# Patient Record
Sex: Female | Born: 1935 | Smoking: Never smoker
Health system: Southern US, Community
[De-identification: ages and names within clinical notes are randomized; demographics above are authoritative.]

## PROBLEM LIST (undated history)

## (undated) DIAGNOSIS — I639 Cerebral infarction, unspecified: Secondary | ICD-10-CM

## (undated) DIAGNOSIS — E119 Type 2 diabetes mellitus without complications: Secondary | ICD-10-CM

## (undated) DIAGNOSIS — E785 Hyperlipidemia, unspecified: Secondary | ICD-10-CM

## (undated) DIAGNOSIS — I10 Essential (primary) hypertension: Secondary | ICD-10-CM

---

## 2017-11-12 ENCOUNTER — Encounter: Payer: Self-pay | Admitting: Emergency Medicine

## 2017-11-12 ENCOUNTER — Emergency Department
Admission: EM | Admit: 2017-11-12 | Discharge: 2017-11-12 | Disposition: A | Discharge status: Home / Self Care | Payer: Medicare Other | Attending: Emergency Medicine | Admitting: Emergency Medicine

## 2017-11-12 DIAGNOSIS — E119 Type 2 diabetes mellitus without complications: Secondary | ICD-10-CM | POA: Diagnosis not present

## 2017-11-12 DIAGNOSIS — I10 Essential (primary) hypertension: Secondary | ICD-10-CM | POA: Diagnosis not present

## 2017-11-12 DIAGNOSIS — Z79899 Other long term (current) drug therapy: Secondary | ICD-10-CM | POA: Insufficient documentation

## 2017-11-12 DIAGNOSIS — E785 Hyperlipidemia, unspecified: Secondary | ICD-10-CM | POA: Diagnosis not present

## 2017-11-12 DIAGNOSIS — Z760 Encounter for issue of repeat prescription: Secondary | ICD-10-CM | POA: Diagnosis present

## 2017-11-12 HISTORY — DX: Hyperlipidemia, unspecified: E78.5

## 2017-11-12 HISTORY — DX: Cerebral infarction, unspecified: I63.9

## 2017-11-12 HISTORY — DX: Essential (primary) hypertension: I10

## 2017-11-12 HISTORY — DX: Type 2 diabetes mellitus without complications: E11.9

## 2017-11-12 MED ORDER — LOSARTAN POTASSIUM 50 MG PO TABS: 50.0000 mg | ORAL_TABLET | Freq: Every day | ORAL | 0 refills | Status: DC

## 2017-11-12 MED ORDER — GABAPENTIN 300 MG PO CAPS: 300.0000 mg | ORAL_CAPSULE | Freq: Every day | ORAL | 0 refills | Status: DC

## 2017-11-12 MED ORDER — AMLODIPINE BESYLATE 5 MG PO TABS: 5.0000 mg | ORAL_TABLET | Freq: Every day | ORAL | 0 refills | Status: DC

## 2017-11-12 MED ORDER — ATORVASTATIN CALCIUM 40 MG PO TABS: 40.0000 mg | ORAL_TABLET | Freq: Every day | ORAL | 0 refills | Status: AC

## 2017-11-12 NOTE — ED Triage Notes (Signed)
PT comes into the ED via POV c/o a need for medication refill.  Patient returned home from [place] yesterday and [facility] cannot get her in until Friday.  Patient is completely out of all her medications for gabapentin, atorvastatin, amlodipine, and losartan.  Patient had a recent stroke while in [place] visiting her family, so her children are concerned about her being out of her blood pressure and cholesterol medication.  Patient denies any problems or pain at this time and in NAD with even and unlabored respirations.

## 2017-11-12 NOTE — ED Provider Notes (Signed)
[facility] Emergency Department Provider Note   ____________________________________________   First MD Initiated Contact with Patient 11/12/17 1501     (approximate)  I have reviewed the triage vital signs and the nursing notes.   HISTORY  Chief Complaint Medication Refill    HPI Madison Barnett is a 84 y.o. female patient today for refill of medication.  Patient is return home from [place] yesterday and was told she could not get appointment for refills at another clinic until November 17, 2017.  Patient is completely out of Neurontin, atorvastatin, Norvasc, and Losaratan.  Patient had a recent CVA while visiting her family in [place] so her children here concerned for being out of medication for 1 week.  Patient denies any distress at this time.  Past Medical History:  Diagnosis Date  . Diabetes mellitus without complication (HCC)   . Hyperlipidemia   . Hypertension   . Stroke [facility](HCC)     There are no active problems to display for this patient.   History reviewed. No pertinent surgical history.  Prior to Admission medications   Medication Sig Start Date End Date Taking? Authorizing Provider  amLODipine (NORVASC) 5 MG tablet Take 5 mg by mouth daily.   Yes [provider]  atorvastatin (LIPITOR) 40 MG tablet Take 40 mg by mouth daily.   Yes [provider]  gabapentin (NEURONTIN) 300 MG capsule Take 300 mg by mouth at bedtime.   Yes [provider]  losartan (COZAAR) 50 MG tablet Take 50 mg by mouth daily.   Yes [provider]  amLODipine (NORVASC) 5 MG tablet Take 1 tablet (5 mg total) by mouth daily. 11/12/17 11/12/18  Joni ReiningSmith, Ronald K, PA-C  atorvastatin (LIPITOR) 40 MG tablet Take 1 tablet (40 mg total) by mouth at bedtime. 11/12/17   Joni ReiningSmith, Ronald K, PA-C  gabapentin (NEURONTIN) 300 MG capsule Take 1 capsule (300 mg total) by mouth at bedtime. 11/12/17 11/12/18  Joni ReiningSmith, Ronald K, PA-C  losartan (COZAAR) 50 MG tablet  Take 1 tablet (50 mg total) by mouth daily. 11/12/17 11/12/18  Joni ReiningSmith, Ronald K, PA-C    Allergies Patient has no known allergies.  No family history on file.  Social History Social History   Tobacco Use  . Smoking status: Never Smoker  . Smokeless tobacco: Never Used  Substance Use Topics  . Alcohol use: No    Frequency: Never  . Drug use: No    Review of Systems Constitutional: No fever/chills Eyes: No visual changes. ENT: No sore throat. Cardiovascular: Denies chest pain. Respiratory: Denies shortness of breath. Gastrointestinal: No abdominal pain.  No nausea, no vomiting.  No diarrhea.  No constipation. Genitourinary: Negative for dysuria. Musculoskeletal: Negative for back pain. Skin: Negative for rash. Neurological: Negative for headaches, focal weakness or numbness. Endocrine:Diabetes, hyperlipidemia, hypertension.  ____________________________________________   PHYSICAL EXAM:  VITAL SIGNS: ED Triage Vitals [11/12/17 1431]  Enc Vitals Group     BP (!) 167/63     Pulse Rate 80     Resp 16     Temp 98.4 F (36.9 C)     Temp Source Oral     SpO2 95 %     Weight 125 lb (56.7 kg)     Height 5\' 1"  (1.549 m)     Head Circumference      Peak Flow      Pain Score      Pain Loc      Pain Edu?  Excl. in GC?     Constitutional: Alert and oriented. Well appearing and in no acute distress. Eyes: Conjunctivae are normal. PERRL. EOMI. Neck: No stridor.  Hematological/Lymphatic/Immunilogical: No cervical lymphadenopathy. Cardiovascular: Normal rate, regular rhythm. Grossly normal heart sounds.  Good peripheral circulation. Respiratory: Normal respiratory effort.  No retractions. Lungs CTAB. Musculoskeletal: No lower extremity tenderness nor edema.  No joint effusions. Neurologic:  Normal speech and language. No gross focal neurologic deficits are appreciated. No gait instability. Skin:  Skin is warm, dry and intact. No rash noted. Psychiatric: Mood and affect  are normal. Speech and behavior are normal.  ____________________________________________   LABS (all labs ordered are listed, but only abnormal results are displayed)  Labs Reviewed - No data to display ____________________________________________  EKG   ____________________________________________  RADIOLOGY  No results found.  ____________________________________________   PROCEDURES  Procedure(s) performed: None  Procedures  Critical Care performed: No  ____________________________________________   INITIAL IMPRESSION / ASSESSMENT AND PLAN / ED COURSE  As part of my medical decision making, I reviewed the following data within the electronic MEDICAL RECORD NUMBER    Patient presents for medication refill.  Patient documentation reveals medication need for hypertension and hyperlipidemia.  Patient advised to follow-up with scheduled appointment on November 17, 2017.      ____________________________________________   FINAL CLINICAL IMPRESSION(S) / ED DIAGNOSES  Final diagnoses:  Encounter for medication refill     ED Discharge Orders        Ordered    losartan (COZAAR) 50 MG tablet  Daily     11/12/17 1512    gabapentin (NEURONTIN) 300 MG capsule  Daily at bedtime     11/12/17 1512    atorvastatin (LIPITOR) 40 MG tablet  Daily at bedtime     11/12/17 1512    amLODipine (NORVASC) 5 MG tablet  Daily     11/12/17 1512       Note:  This document was prepared using Dragon voice recognition software and may include unintentional dictation errors.    Joni Reining, PA-C 11/12/17 1520    Phineas Semen, MD 11/12/17 [phone]

## 2017-11-12 NOTE — ED Notes (Signed)
See triage note  Per family she recently moved back here with family from [place]  She has [facility]

## 2017-12-29 ENCOUNTER — Encounter: Payer: Self-pay | Admitting: Emergency Medicine

## 2017-12-29 ENCOUNTER — Other Ambulatory Visit: Payer: Self-pay

## 2017-12-29 ENCOUNTER — Emergency Department
Admission: EM | Admit: 2017-12-29 | Discharge: 2017-12-29 | Disposition: A | Discharge status: Home / Self Care | Payer: Medicare Other | Attending: Emergency Medicine | Admitting: Emergency Medicine

## 2017-12-29 DIAGNOSIS — E1342 Other specified diabetes mellitus with diabetic polyneuropathy: Secondary | ICD-10-CM | POA: Diagnosis not present

## 2017-12-29 DIAGNOSIS — Z79899 Other long term (current) drug therapy: Secondary | ICD-10-CM | POA: Diagnosis not present

## 2017-12-29 DIAGNOSIS — Z7982 Long term (current) use of aspirin: Secondary | ICD-10-CM | POA: Diagnosis not present

## 2017-12-29 DIAGNOSIS — Z794 Long term (current) use of insulin: Secondary | ICD-10-CM | POA: Insufficient documentation

## 2017-12-29 DIAGNOSIS — I10 Essential (primary) hypertension: Secondary | ICD-10-CM | POA: Insufficient documentation

## 2017-12-29 DIAGNOSIS — M79672 Pain in left foot: Secondary | ICD-10-CM | POA: Diagnosis present

## 2017-12-29 DIAGNOSIS — Z8673 Personal history of transient ischemic attack (TIA), and cerebral infarction without residual deficits: Secondary | ICD-10-CM | POA: Diagnosis not present

## 2017-12-29 LAB — GLUCOSE, CAPILLARY: Glucose-Capillary: 186 mg/dL — ABNORMAL HIGH (ref 65–99)

## 2017-12-29 MED ORDER — GABAPENTIN 600 MG PO TABS
300.0000 mg | ORAL_TABLET | Freq: Once | ORAL | Status: AC
  Administered 2017-12-29: 300 mg via ORAL
  Filled 2017-12-29: qty 1

## 2017-12-29 MED ORDER — GABAPENTIN 300 MG PO CAPS: 300.0000 mg | ORAL_CAPSULE | Freq: Every day | ORAL | 0 refills | Status: AC

## 2017-12-29 NOTE — ED Notes (Signed)
Patient to room 9, Emma RN aware of placement in room.

## 2017-12-29 NOTE — ED Provider Notes (Signed)
[facility] Emergency Department Provider Note  ____________________________________________   I have reviewed the triage vital signs and the nursing notes. Where available I have reviewed prior notes and, if possible and indicated, outside hospital notes.    HISTORY  Chief Complaint Foot Pain    HPI Madison Barnett is a 84 y.o. female with a history of diabetes, a long history of diabetic neuropathy with burning to her feet for several years.  She was on gabapentin for a long time and that The symptoms under control however about a month ago she was taken off the gabapentin and now she has had gradually recurring burning sensation in both feet.  Patient is not clear why she had gabapentin taken off and I do not see any clear indication in the records as to why that happened.  She states that it is mostly at night she has a burning feeling in both feet.  She denies any trauma numbness or weakness, she denies any fever or chills, is worse when she lies down and is trying to go to bed.  She does not have to significant symptoms at this time.  Family would also like to be referred to an endocrinologist as an outpatient.   *   Past Medical History:  Diagnosis Date  . Diabetes mellitus without complication (HCC)   . Hyperlipidemia   . Hypertension   . Stroke [facility](HCC)     There are no active problems to display for this patient.   History reviewed. No pertinent surgical history.  Prior to Admission medications   Medication Sig Start Date End Date Taking? Authorizing Provider  amLODipine (NORVASC) 5 MG tablet Take 1 tablet (5 mg total) by mouth daily. 11/12/17 11/12/18 Yes Joni ReiningSmith, Ronald K, PA-C  aspirin EC 81 MG tablet Take 81 mg by mouth daily.   Yes [provider]  atorvastatin (LIPITOR) 40 MG tablet Take 1 tablet (40 mg total) by mouth at bedtime. 11/12/17  Yes Joni ReiningSmith, Ronald K, PA-C  gabapentin (NEURONTIN) 300 MG capsule Take 1 capsule (300 mg total) by  mouth at bedtime. 11/12/17 11/12/18 Yes Joni ReiningSmith, Ronald K, PA-C  insulin glargine (LANTUS) 100 UNIT/ML injection Inject 25 Units into the skin at bedtime.   Yes [provider]  insulin lispro (HUMALOG) 100 UNIT/ML injection Inject 0-4 Units into the skin. Per sliding scale   Yes [provider]  levothyroxine (SYNTHROID, LEVOTHROID) 300 MCG tablet Take 300 mcg by mouth daily before breakfast.   Yes [provider]  losartan (COZAAR) 50 MG tablet Take 1 tablet (50 mg total) by mouth daily. 11/12/17 11/12/18 Yes Joni ReiningSmith, Ronald K, PA-C  metFORMIN (GLUCOPHAGE) 500 MG tablet Take 500 mg by mouth 2 (two) times daily.   Yes [provider]  multivitamin-lutein (OCUVITE-LUTEIN) CAPS capsule Take 1 capsule by mouth daily.   Yes [provider]    Allergies Patient has no known allergies.  No family history on file.  Social History Social History   Tobacco Use  . Smoking status: Never Smoker  . Smokeless tobacco: Never Used  Substance Use Topics  . Alcohol use: No    Frequency: Never  . Drug use: No    Review of Systems Constitutional: No fever/chills Eyes: No visual changes. ENT: No sore throat. No stiff neck no neck pain Cardiovascular: Denies chest pain. Respiratory: Denies shortness of breath. Gastrointestinal:   no vomiting.  No diarrhea.  No constipation. Genitourinary: Negative for dysuria. Musculoskeletal: Negative lower extremity swelling Skin:  Negative for rash. Neurological: Negative for severe headaches, focal weakness or numbness.   ____________________________________________   PHYSICAL EXAM:  VITAL SIGNS: ED Triage Vitals  Enc Vitals Group     BP 12/29/17 0925 (!) 156/52     Pulse Rate 12/29/17 0925 84     Resp 12/29/17 0925 18     Temp 12/29/17 0925 98.5 F (36.9 C)     Temp Source 12/29/17 0925 Oral     SpO2 12/29/17 0925 97 %     Weight 12/29/17 0926 124 lb (56.2 kg)     Height 12/29/17 0926 5\' 1"  (1.549 m)      Head Circumference --      Peak Flow --      Pain Score 12/29/17 0925 3     Pain Loc --      Pain Edu? --      Excl. in GC? --     Constitutional: Alert and oriented. Well appearing and in no acute distress. Eyes: Conjunctivae are normal Head: Atraumatic HEENT: No congestion/rhinnorhea. Mucous membranes are moist.  Oropharynx non-erythematous Neck:   Nontender with no meningismus, no masses, no stridor Cardiovascular: Normal rate, regular rhythm. Grossly normal heart sounds.  Good peripheral circulation. Respiratory: Normal respiratory effort.  No retractions. Lungs CTAB. Abdominal: Soft and nontender. No distention. No guarding no rebound Back:  There is no focal tenderness or step off.  there is no midline tenderness there are no lesions noted. there is no CVA tenderness Musculoskeletal: No lower extremity tenderness, no upper extremity tenderness. No joint effusions, no DVT signs strong distal pulses no edema, both feet are warm well perfused with good pulses there is intact sensation and there is no evidence of infection or trauma they are nontender, compartments are soft, there is no redness or streaking or abscess etc. Neurologic:  Normal speech and language. No gross focal neurologic deficits are appreciated.  Skin:  Skin is warm, dry and intact. No rash noted. Psychiatric: Mood and affect are normal. Speech and behavior are normal.  ____________________________________________   LABS (all labs ordered are listed, but only abnormal results are displayed)  Labs Reviewed  GLUCOSE, CAPILLARY - Abnormal; Notable for the following components:      Result Value   Glucose-Capillary 186 (*)    All other components within normal limits  CBG MONITORING, ED    Pertinent labs  results that were available during my care of the patient were reviewed by me and considered in my medical decision making (see chart for details). ____________________________________________  EKG  I  personally interpreted any EKGs ordered by me or triage  ____________________________________________  RADIOLOGY  Pertinent labs & imaging results that were available during my care of the patient were reviewed by me and considered in my medical decision making (see chart for details). If possible, patient and/or family made aware of any abnormal findings.  No results found. ____________________________________________    PROCEDURES  Procedure(s) performed: None  Procedures  Critical Care performed: None  ____________________________________________   INITIAL IMPRESSION / ASSESSMENT AND PLAN / ED COURSE  Pertinent labs & imaging results that were available during my care of the patient were reviewed by me and considered in my medical decision making (see chart for details).  Patient here with burning sensation in both feet for years worse after going off her Neurontin, we will restart her Neurontin and she will follow closely with her primary care doctor.  We will also refer her as an outpatient to endocrinology  at her request.  Nothing to suggest trauma, fracture, infection, vascular insufficiency at this time.    ____________________________________________   FINAL CLINICAL IMPRESSION(S) / ED DIAGNOSES  Final diagnoses:  None      This chart was dictated using voice recognition software.  Despite best efforts to proofread,  errors can occur which can change meaning.      Jeanmarie Plant, MD 12/29/17 [phone]

## 2017-12-29 NOTE — ED Triage Notes (Signed)
Son states pt has been c/o bilateral foot pain, mostly at night, concerned this is r/t her diabetes, does not hurt with walking, has not taken ibuprofen or tylenol for pain.

## 2017-12-29 NOTE — ED Notes (Signed)
Patient presents with bilateral foot pain. Patient describes the pain as a "burning". Patient reports it started last night. No skin issues to feet noted. Pulses and sensation presents bilaterally. Brisk cap refill noted. Hx of diabetes.

## 2017-12-29 NOTE — ED Notes (Signed)
First Nurse Note:  [facility] complaining of foot pain bilaterally.  States she wants to see an endocrinologist because she is diabetic.

## 2018-09-23 ENCOUNTER — Emergency Department: Payer: Medicare (Managed Care)

## 2018-09-23 ENCOUNTER — Observation Stay: Payer: Medicare (Managed Care)

## 2018-09-23 ENCOUNTER — Observation Stay
Admission: EM | Admit: 2018-09-23 | Discharge: 2018-09-25 | Disposition: A | Discharge status: Home / Self Care | Payer: Medicare (Managed Care) | Attending: Internal Medicine | Admitting: Internal Medicine

## 2018-09-23 ENCOUNTER — Other Ambulatory Visit: Payer: Self-pay

## 2018-09-23 DIAGNOSIS — I10 Essential (primary) hypertension: Secondary | ICD-10-CM | POA: Insufficient documentation

## 2018-09-23 DIAGNOSIS — E039 Hypothyroidism, unspecified: Secondary | ICD-10-CM | POA: Diagnosis not present

## 2018-09-23 DIAGNOSIS — W06XXXA Fall from bed, initial encounter: Secondary | ICD-10-CM | POA: Insufficient documentation

## 2018-09-23 DIAGNOSIS — Z8673 Personal history of transient ischemic attack (TIA), and cerebral infarction without residual deficits: Secondary | ICD-10-CM | POA: Diagnosis not present

## 2018-09-23 DIAGNOSIS — W19XXXA Unspecified fall, initial encounter: Secondary | ICD-10-CM

## 2018-09-23 DIAGNOSIS — B9620 Unspecified Escherichia coli [E. coli] as the cause of diseases classified elsewhere: Secondary | ICD-10-CM | POA: Diagnosis not present

## 2018-09-23 DIAGNOSIS — E119 Type 2 diabetes mellitus without complications: Secondary | ICD-10-CM | POA: Diagnosis not present

## 2018-09-23 DIAGNOSIS — Z7982 Long term (current) use of aspirin: Secondary | ICD-10-CM | POA: Insufficient documentation

## 2018-09-23 DIAGNOSIS — Z79899 Other long term (current) drug therapy: Secondary | ICD-10-CM | POA: Diagnosis not present

## 2018-09-23 DIAGNOSIS — M170 Bilateral primary osteoarthritis of knee: Secondary | ICD-10-CM | POA: Insufficient documentation

## 2018-09-23 DIAGNOSIS — E785 Hyperlipidemia, unspecified: Secondary | ICD-10-CM | POA: Diagnosis not present

## 2018-09-23 DIAGNOSIS — R531 Weakness: Secondary | ICD-10-CM

## 2018-09-23 DIAGNOSIS — N390 Urinary tract infection, site not specified: Secondary | ICD-10-CM | POA: Diagnosis present

## 2018-09-23 DIAGNOSIS — S32592A Other specified fracture of left pubis, initial encounter for closed fracture: Secondary | ICD-10-CM | POA: Diagnosis not present

## 2018-09-23 DIAGNOSIS — Z794 Long term (current) use of insulin: Secondary | ICD-10-CM | POA: Diagnosis not present

## 2018-09-23 DIAGNOSIS — M25552 Pain in left hip: Secondary | ICD-10-CM

## 2018-09-23 LAB — CBC
HCT: 40.3 % (ref 36.0–46.0)
Hemoglobin: 13.6 g/dL (ref 12.0–15.0)
MCH: 31.8 pg (ref 26.0–34.0)
MCHC: 33.7 g/dL (ref 30.0–36.0)
MCV: 94.2 fL (ref 80.0–100.0)
NRBC: 0 % (ref 0.0–0.2)
PLATELETS: 252 10*3/uL (ref 150–400)
RBC: 4.28 MIL/uL (ref 3.87–5.11)
RDW: 11.6 % (ref 11.5–15.5)
WBC: 11.2 10*3/uL — AB (ref 4.0–10.5)

## 2018-09-23 LAB — URINALYSIS, ROUTINE W REFLEX MICROSCOPIC
Bilirubin Urine: NEGATIVE
GLUCOSE, UA: NEGATIVE mg/dL
HGB URINE DIPSTICK: NEGATIVE
Ketones, ur: NEGATIVE mg/dL
Nitrite: NEGATIVE
Protein, ur: 30 mg/dL — AB
SPECIFIC GRAVITY, URINE: 1.006 (ref 1.005–1.030)
SQUAMOUS EPITHELIAL / LPF: NONE SEEN (ref 0–5)
WBC, UA: >50 WBC/hpf — ABNORMAL HIGH (ref 0–5)
pH: 7 (ref 5.0–8.0)

## 2018-09-23 LAB — COMPREHENSIVE METABOLIC PANEL
ALBUMIN: 4 g/dL (ref 3.5–5.0)
ALT: 18 U/L (ref 0–44)
AST: 26 U/L (ref 15–41)
Alkaline Phosphatase: 93 U/L (ref 38–126)
Anion gap: 10 (ref 5–15)
BUN: 19 mg/dL (ref 8–23)
CHLORIDE: 101 mmol/L (ref 98–111)
CO2: 27 mmol/L (ref 22–32)
Calcium: 9 mg/dL (ref 8.9–10.3)
Creatinine, Ser: 0.82 mg/dL (ref 0.44–1.00)
GFR calc Af Amer: >60 mL/min (ref >60)
GLUCOSE: 171 mg/dL — AB (ref 70–99)
POTASSIUM: 3.9 mmol/L (ref 3.5–5.1)
Sodium: 138 mmol/L (ref 135–145)
Total Bilirubin: 1.4 mg/dL — ABNORMAL HIGH (ref 0.3–1.2)
Total Protein: 8.1 g/dL (ref 6.5–8.1)

## 2018-09-23 LAB — GLUCOSE, CAPILLARY
GLUCOSE-CAPILLARY: 178 mg/dL — AB (ref 70–99)
Glucose-Capillary: 189 mg/dL — ABNORMAL HIGH (ref 70–99)

## 2018-09-23 LAB — TROPONIN I

## 2018-09-23 MED ORDER — ASPIRIN EC 81 MG PO TBEC
81.0000 mg | DELAYED_RELEASE_TABLET | Freq: Every day | ORAL | Status: DC
  Administered 2018-09-24 – 2018-09-25 (×2): 81 mg via ORAL
  Filled 2018-09-23 (×2): qty 1

## 2018-09-23 MED ORDER — INSULIN GLARGINE 100 UNIT/ML ~~LOC~~ SOLN
18.0000 [IU] | Freq: Every day | SUBCUTANEOUS | Status: DC
  Administered 2018-09-23 – 2018-09-24 (×2): 18 [IU] via SUBCUTANEOUS
  Filled 2018-09-23 (×3): qty 0.18

## 2018-09-23 MED ORDER — ONDANSETRON HCL 4 MG PO TABS: 4.0000 mg | ORAL_TABLET | Freq: Four times a day (QID) | ORAL | Status: DC | PRN

## 2018-09-23 MED ORDER — POLYETHYLENE GLYCOL 3350 17 G PO PACK: 17.0000 g | PACK | Freq: Every day | ORAL | Status: DC | PRN

## 2018-09-23 MED ORDER — ACETAMINOPHEN 650 MG RE SUPP: 650.0000 mg | Freq: Four times a day (QID) | RECTAL | Status: DC | PRN

## 2018-09-23 MED ORDER — INSULIN ASPART 100 UNIT/ML ~~LOC~~ SOLN: 0.0000 [IU] | Freq: Every day | SUBCUTANEOUS | Status: DC

## 2018-09-23 MED ORDER — LOSARTAN POTASSIUM 50 MG PO TABS
50.0000 mg | ORAL_TABLET | Freq: Every day | ORAL | Status: DC
  Administered 2018-09-24 – 2018-09-25 (×2): 50 mg via ORAL
  Filled 2018-09-23 (×2): qty 1

## 2018-09-23 MED ORDER — ONDANSETRON HCL 4 MG/2ML IJ SOLN: 4.0000 mg | Freq: Four times a day (QID) | INTRAMUSCULAR | Status: DC | PRN

## 2018-09-23 MED ORDER — ENOXAPARIN SODIUM 40 MG/0.4ML ~~LOC~~ SOLN
40.0000 mg | SUBCUTANEOUS | Status: DC
  Administered 2018-09-23 – 2018-09-24 (×2): 40 mg via SUBCUTANEOUS
  Filled 2018-09-23 (×2): qty 0.4

## 2018-09-23 MED ORDER — SODIUM CHLORIDE 0.9 % IV SOLN
1.0000 g | Freq: Once | INTRAVENOUS | Status: AC
  Administered 2018-09-23: 1 g via INTRAVENOUS
  Filled 2018-09-23: qty 10

## 2018-09-23 MED ORDER — INSULIN ASPART 100 UNIT/ML ~~LOC~~ SOLN
0.0000 [IU] | Freq: Three times a day (TID) | SUBCUTANEOUS | Status: DC
  Administered 2018-09-23 – 2018-09-25 (×5): 3 [IU] via SUBCUTANEOUS
  Administered 2018-09-25: 11 [IU] via SUBCUTANEOUS
  Filled 2018-09-23 (×6): qty 1

## 2018-09-23 MED ORDER — CEPHALEXIN 500 MG PO CAPS: 500.0000 mg | ORAL_CAPSULE | Freq: Two times a day (BID) | ORAL | 0 refills | Status: DC

## 2018-09-23 MED ORDER — ATORVASTATIN CALCIUM 20 MG PO TABS
40.0000 mg | ORAL_TABLET | Freq: Every day | ORAL | Status: DC
  Administered 2018-09-23 – 2018-09-24 (×2): 40 mg via ORAL
  Filled 2018-09-23 (×2): qty 2

## 2018-09-23 MED ORDER — LEVOTHYROXINE SODIUM 100 MCG PO TABS
300.0000 ug | ORAL_TABLET | Freq: Every day | ORAL | Status: DC
  Filled 2018-09-23: qty 3

## 2018-09-23 MED ORDER — AMLODIPINE BESYLATE 5 MG PO TABS
5.0000 mg | ORAL_TABLET | Freq: Every day | ORAL | Status: DC
  Administered 2018-09-24 – 2018-09-25 (×2): 5 mg via ORAL
  Filled 2018-09-23 (×2): qty 1

## 2018-09-23 MED ORDER — ACETAMINOPHEN 325 MG PO TABS
650.0000 mg | ORAL_TABLET | Freq: Four times a day (QID) | ORAL | Status: DC | PRN
  Administered 2018-09-23: 650 mg via ORAL
  Filled 2018-09-23: qty 2

## 2018-09-23 MED ORDER — GABAPENTIN 300 MG PO CAPS
300.0000 mg | ORAL_CAPSULE | Freq: Every day | ORAL | Status: DC
  Administered 2018-09-23 – 2018-09-24 (×2): 300 mg via ORAL
  Filled 2018-09-23 (×2): qty 1

## 2018-09-23 NOTE — ED Notes (Signed)
Dr Lenard LancePaduchowski talked to pt and per their discussion pt will be admitted for the night

## 2018-09-23 NOTE — ED Provider Notes (Addendum)
[facility] Emergency Department Provider Note  Time seen: 7:28 AM  I have reviewed the triage vital signs and the nursing notes.   HISTORY  Chief Complaint Fall    HPI Madison Barnett is a 84 y.o. female with a past medical history of diabetes, hypertension, CVA, presents to the emergency department after a fall with generalized weakness.  According to the patient and her son over the past 1 week she has had somewhat progressive generalized weakness.  States she had a fall yesterday and then again this morning due to the weakness.  Patient is complaining of some mild pain in her right hip and pelvis.  Denies hitting her head.  Denies LOC.  Son states yesterday and today the patient has required assistance with ambulating and normally she is able to ambulate on her own.  Patient denies any fever, cough or congestion denies any vomiting or diarrhea.   Past Medical History:  Diagnosis Date  . Diabetes mellitus without complication (HCC)   . Hyperlipidemia   . Hypertension   . Stroke [facility](HCC)     There are no active problems to display for this patient.   History reviewed. No pertinent surgical history.  Prior to Admission medications   Medication Sig Start Date End Date Taking? Authorizing Provider  amLODipine (NORVASC) 5 MG tablet Take 1 tablet (5 mg total) by mouth daily. 11/12/17 11/12/18  Joni ReiningSmith, Ronald K, PA-C  aspirin EC 81 MG tablet Take 81 mg by mouth daily.    [provider]  atorvastatin (LIPITOR) 40 MG tablet Take 1 tablet (40 mg total) by mouth at bedtime. 11/12/17   Joni ReiningSmith, Ronald K, PA-C  gabapentin (NEURONTIN) 300 MG capsule Take 1 capsule (300 mg total) by mouth at bedtime. 12/29/17 12/29/18  Jeanmarie PlantMcShane, James A, MD  insulin glargine (LANTUS) 100 UNIT/ML injection Inject 25 Units into the skin at bedtime.    [provider]  insulin lispro (HUMALOG) 100 UNIT/ML injection Inject 0-4 Units into the skin. Per sliding scale    [provider]  levothyroxine (SYNTHROID, LEVOTHROID) 300 MCG tablet Take 300 mcg by mouth daily before breakfast.    [provider]  losartan (COZAAR) 50 MG tablet Take 1 tablet (50 mg total) by mouth daily. 11/12/17 11/12/18  Joni ReiningSmith, Ronald K, PA-C  metFORMIN (GLUCOPHAGE) 500 MG tablet Take 500 mg by mouth 2 (two) times daily.    [provider]  multivitamin-lutein (OCUVITE-LUTEIN) CAPS capsule Take 1 capsule by mouth daily.    [provider]    No Known Allergies  No family history on file.  Social History Social History   Tobacco Use  . Smoking status: Never Smoker  . Smokeless tobacco: Never Used  Substance Use Topics  . Alcohol use: No    Frequency: Never  . Drug use: No    Review of Systems Constitutional: Negative for fever.  Positive for generalized weakness. Cardiovascular: Negative for chest pain. Respiratory: Negative for shortness of breath. Gastrointestinal: Negative for abdominal pain, vomiting  Genitourinary: Negative for urinary compaints Musculoskeletal: Right hip/pelvis pain Skin: Negative for skin complaints  Neurological: Negative for headache All other ROS negative  ____________________________________________   PHYSICAL EXAM:  VITAL SIGNS: ED Triage Vitals  Enc Vitals Group     BP 09/23/18 0640 (!) 156/68     Pulse Rate 09/23/18 0640 88     Resp 09/23/18 0640 17     Temp 09/23/18 0640 98 F (36.7 C)  Temp src --      SpO2 09/23/18 0640 97 %     Weight 09/23/18 0641 131 lb (59.4 kg)     Height 09/23/18 0641 5' 2.5" (1.588 m)     Head Circumference --      Peak Flow --      Pain Score 09/23/18 0641 9     Pain Loc --      Pain Edu? --      Excl. in GC? --    Constitutional: Alert and oriented. Well appearing and in no distress. Eyes: Normal exam ENT   Head: Normocephalic and atraumatic.   Mouth/Throat: Mucous membranes are moist. Cardiovascular: Normal rate, regular rhythm.  Respiratory: Normal  respiratory effort without tachypnea nor retractions. Breath sounds are clear Gastrointestinal: Soft and nontender. No distention.   Musculoskeletal: Good range of motion in bilateral upper extremities without pain or tenderness, patient does have mild pain with range of motion of right lower extremity in the right hip/pelvis.  Great range of motion left lower extremity.  Hips are nontender to palpation bilaterally. Neurologic:  Normal speech and language. No gross focal neurologic deficits are appreciated. Skin:  Skin is warm, dry and intact.  Psychiatric: Mood and affect are normal. Speech and behavior are normal.   ____________________________________________    EKG  EKG viewed and interpreted by myself shows normal sinus rhythm 85 bpm with a narrow QRS, normal axis, normal intervals, no concerning ST changes.  ____________________________________________    RADIOLOGY  X-rays are negative.  ____________________________________________   INITIAL IMPRESSION / ASSESSMENT AND PLAN / ED COURSE  Pertinent labs & imaging results that were available during my care of the patient were reviewed by me and considered in my medical decision making (see chart for details).  Patient presents to the emergency department for generalized fatigue weakness and a fall this morning complaining of pain in her right hip/pelvis.  We will check x-rays given the fall.  We will check labs given the complaint of generalized weakness over the past 1 week.  Currently the patient appears well, no distress.  X-rays are negative.  Labs are largely within normal limits besides her urinalysis which shows a significant urinary tract infection.  We will dose IV antibiotics.  Otherwise the patient appears well.  Discussed options with the patient, we will discharge home with oral antibiotics.    Patient wants to go home however she is spoken to her son who believes the patient is too weak as he states he has had to  carry her yesterday and this morning due to generalized weakness and states normally she ambulates without issue.  Given her significant weakness with 2 falls in the past 24 hours and a significant urinary tract infection we will admit to the hospital service for continued treatment.  ____________________________________________   FINAL CLINICAL IMPRESSION(S) / ED DIAGNOSES  Pelvic pain Fall Weakness Urinary tract infection   Minna Antis, MD 09/23/18 1007    Minna Antis, MD 09/23/18 1042

## 2018-09-23 NOTE — ED Triage Notes (Signed)
Patient coming [facility] from home for fall from bed; approx 3 foot fall. Patient c/o right hip, leg, groin, and knee pain post fall.   EMS vitals: 160/90, 98% RA, 97 bpm, 178 CBG

## 2018-09-23 NOTE — ED Notes (Signed)
ED TO INPATIENT HANDOFF REPORT  Name/Age/Gender Madison Barnett Madison Barnett 84 y.o. female  Code Status   Home/SNF/Other Home  Chief Complaint Fall  Level of Care/Admitting Diagnosis ED Disposition    ED Disposition Condition Comment   Admit  Hospital Area: [facility] [[id number]]  Level of Care: Med-Surg [16]  Diagnosis: UTI (urinary tract infection) [914782]  Admitting Physician: Willadean Carol DODD [[id number]]  Attending Physician: Willadean Carol DODD [[id number]]  PT Class (Do Not Modify): Observation [104]  PT Acc Code (Do Not Modify): Observation [10022]       Medical History Past Medical History:  Diagnosis Date  . Diabetes mellitus without complication (HCC)   . Hyperlipidemia   . Hypertension   . Stroke [facility])     Allergies No Known Allergies  IV Location/Drains/Wounds Patient Lines/Drains/Airways Status   Active Line/Drains/Airways    Name:   Placement date:   Placement time:   Site:   Days:   Peripheral IV 09/23/18 Left Antecubital   09/23/18    0758    Antecubital   less than 1          Labs/Imaging Results for orders placed or performed during the hospital encounter of 09/23/18 (from the past 48 hour(s))  CBC     Status: Abnormal   Collection Time: 09/23/18  7:59 AM  Result Value Ref Range   WBC 11.2 (H) 4.0 - 10.5 K/uL   RBC 4.28 3.87 - 5.11 MIL/uL   Hemoglobin 13.6 12.0 - 15.0 g/dL   HCT 96.2 95.2 - 84.1 %   MCV 94.2 80.0 - 100.0 fL   MCH 31.8 26.0 - 34.0 pg   MCHC 33.7 30.0 - 36.0 g/dL   RDW 32.4 40.1 - 02.7 %   Platelets 252 150 - 400 K/uL   nRBC 0.0 0.0 - 0.2 %    Comment: Performed at [facility], [address] [place], [place] [address]  Comprehensive metabolic panel     Status: Abnormal   Collection Time: 09/23/18  7:59 AM  Result Value Ref Range   Sodium 138 135 - 145 mmol/L   Potassium 3.9 3.5 - 5.1 mmol/L   Chloride 101 98 - 111 mmol/L   CO2 27 22 - 32 mmol/L   Glucose, Bld 171 (H) 70 - 99 mg/dL   BUN 19 8 - 23  mg/dL   Creatinine, Ser 4.40 0.44 - 1.00 mg/dL   Calcium 9.0 8.9 - 34.7 mg/dL   Total Protein 8.1 6.5 - 8.1 g/dL   Albumin 4.0 3.5 - 5.0 g/dL   AST 26 15 - 41 U/L   ALT 18 0 - 44 U/L   Alkaline Phosphatase 93 38 - 126 U/L   Total Bilirubin 1.4 (H) 0.3 - 1.2 mg/dL   GFR calc non Af Amer >60 >60 mL/min   GFR calc Af Amer >60 >60 mL/min   Anion gap 10 5 - 15    Comment: Performed at [facility], [address] [place], [place] [address]  Troponin I - ONCE - STAT     Status: None   Collection Time: 09/23/18  7:59 AM  Result Value Ref Range   Troponin I <0.03 <0.03 ng/mL    Comment: Performed at [facility], [address] [place], [place] [address]  Urinalysis, Routine w reflex microscopic     Status: Abnormal   Collection Time: 09/23/18  7:59 AM  Result Value Ref Range   Color, Urine YELLOW (A) YELLOW  APPearance TURBID (A) CLEAR   Specific Gravity, Urine 1.006 1.005 - 1.030   pH 7.0 5.0 - 8.0   Glucose, UA NEGATIVE NEGATIVE mg/dL   Hgb urine dipstick NEGATIVE NEGATIVE   Bilirubin Urine NEGATIVE NEGATIVE   Ketones, ur NEGATIVE NEGATIVE mg/dL   Protein, ur 30 (A) NEGATIVE mg/dL   Nitrite NEGATIVE NEGATIVE   Leukocytes, UA LARGE (A) NEGATIVE   RBC / HPF >50 (H) 0 - 5 RBC/hpf   WBC, UA >50 (H) 0 - 5 WBC/hpf   Bacteria, UA MANY (A) NONE SEEN   Squamous Epithelial / LPF NONE SEEN 0 - 5   WBC Clumps PRESENT    Mucus PRESENT     Comment: Performed at [facility], [address] [place], [place] [address]   Dg Knee Complete 4 Views Right  Result Date: 09/23/2018 CLINICAL DATA:  Fall.  Right knee pain.  Initial encounter. EXAM: RIGHT KNEE - COMPLETE 4+ VIEW COMPARISON:  None. FINDINGS: No evidence of fracture, dislocation, or joint effusion. No evidence of arthropathy or other focal bone abnormality. Generalized osteopenia and peripheral vascular calcification noted. Soft tissues are unremarkable. IMPRESSION: No acute findings. Osteopenia and  peripheral vascular calcification. Electronically Signed   By: Myles RosenthalJohn  Stahl M.D.   On: 09/23/2018 08:02   Dg Hip Unilat  With Pelvis 2-3 Views Right  Result Date: 09/23/2018 CLINICAL DATA:  Initial encounter for Fall from bed this morning as well as yesterday morning. Approx 3 foot fall. Patient states right hip, leg, groin, and knee pain following fall. Hx of stroke. EXAM: DG HIP (WITH OR WITHOUT PELVIS) 2-3V RIGHT COMPARISON:  None. FINDINGS: Femoral heads are located. Vascular calcifications. Osteopenia. No acute fracture. IMPRESSION: No acute osseous abnormality. Electronically Signed   By: Jeronimo GreavesKyle  Talbot M.D.   On: 09/23/2018 08:02    Pending Labs Unresulted Labs (From admission, onward)    Start     Ordered   09/23/18 1009  Urine Culture  Add-on,   AD     09/23/18 1009   Signed and Held  CBC  (enoxaparin (LOVENOX)    CrCl >/= 30 ml/min)  Once,   R    Comments:  Baseline for enoxaparin therapy IF NOT ALREADY DRAWN.  Notify MD if PLT < 100 K.    Signed and Held   Signed and Held  Creatinine, serum  (enoxaparin (LOVENOX)    CrCl >/= 30 ml/min)  Once,   R    Comments:  Baseline for enoxaparin therapy IF NOT ALREADY DRAWN.    Signed and Held   Signed and Held  Creatinine, serum  (enoxaparin (LOVENOX)    CrCl >/= 30 ml/min)  Weekly,   R    Comments:  while on enoxaparin therapy    Signed and Held   Signed and Held  Basic metabolic panel  Tomorrow morning,   R     Signed and Held   Signed and Held  CBC  Tomorrow morning,   R     Signed and Held          Vitals/Pain Today's Vitals   09/23/18 1000 09/23/18 1030 09/23/18 1100 09/23/18 1130  BP: (!) 149/58 (!) 144/56 128/75 (!) 143/58  Pulse: 87 83    Resp: (!) 24 17 18  (!) 26  Temp:      SpO2: 97% 97%    Weight:      Height:      PainSc:        Isolation Precautions No  active isolations  Medications Medications  cefTRIAXone (ROCEPHIN) 1 g in sodium chloride 0.9 % 100 mL IVPB (0 g Intravenous Stopped 09/23/18 1039)     Mobility Walks at home

## 2018-09-23 NOTE — Progress Notes (Signed)
Family Meeting Note  Advance Directive:yes  Today a meeting took place with the Patient and daughter and granddaughter.  Patient is able to participate.  The following clinical team members were present during this meeting:MD  The following were discussed:Patient's diagnosis: , Patient's progosis: Unable to determine and Goals for treatment: DNR   Patient states that she has thought a lot about resuscitation, especially after she had to make some difficult decisions when her husband became very ill and passed away.  She states that she is overall pretty healthy, although she does have some chronic medical conditions including hypertension, type 2 diabetes, hyperlipidemia, and hypothyroidism.  She states that she would not want to be resuscitated due to her age.  Her daughter and granddaughter support her wishes.  Will make patient DNR.  Additional follow-up to be provided: prn  Time spent during discussion:20 minutes  Madison SinclairKaty D Mayo, MD

## 2018-09-23 NOTE — H&P (Signed)
[facility] [place] [facility]   PATIENT NAME: Madison Barnett    MR#:  [id number]  DATE OF BIRTH:  21-Dec-1932  DATE OF ADMISSION:  09/23/2018  PRIMARY CARE PHYSICIAN: Barbette ReichmannHande, Vishwanath, MD   REQUESTING/REFERRING PHYSICIAN: Minna AntisKevin Paduchowski, MD  CHIEF COMPLAINT:   Chief Complaint  Patient presents with  . Fall    HISTORY OF PRESENT ILLNESS:  Madison Barnett  is a 84 y.o. female with a known history of hypertension, type 2 diabetes, hyperlipidemia, hypothyroidism, history of stroke who presents to the ED with bilateral leg pain and mechanical fall x2.  She fell yesterday and again this morning.  She states this was due to generalized weakness and chronic bilateral hip and knee pain.  She did not hit her head.  She did not lose consciousness.  She denies any fevers, chills, or feeling bad.  She denies any dysuria, frequency, urgency, or suprapubic abdominal pain.  In the ED, but also are unremarkable.  Labs are significant for WBC 11.2.  UA with large leukocytes, many bacteria, >50 RBCs.  She was given a dose of ceftriaxone.  Hospitalists were called for admission.  PAST MEDICAL HISTORY:   Past Medical History:  Diagnosis Date  . Diabetes mellitus without complication (HCC)   . Hyperlipidemia   . Hypertension   . Stroke [facility](HCC)     PAST SURGICAL HISTORY:  History reviewed. No pertinent surgical history.  SOCIAL HISTORY:   Social History   Tobacco Use  . Smoking status: Never Smoker  . Smokeless tobacco: Never Used  Substance Use Topics  . Alcohol use: No    Frequency: Never    FAMILY HISTORY:  No family history on file.  DRUG ALLERGIES:  No Known Allergies  REVIEW OF SYSTEMS:   Review of Systems  Constitutional: Negative for chills and fever.  HENT: Negative for congestion and sore throat.   Eyes: Negative for blurred vision and double vision.  Respiratory: Negative for cough and shortness of breath.   Cardiovascular: Negative for chest  pain, palpitations and leg swelling.  Gastrointestinal: Negative for abdominal pain, diarrhea, nausea and vomiting.  Genitourinary: Negative for dysuria, flank pain, frequency, hematuria and urgency.  Musculoskeletal: Positive for falls and joint pain. Negative for back pain.  Neurological: Negative for dizziness and headaches.  Psychiatric/Behavioral: Negative for depression. The patient is not nervous/anxious.     MEDICATIONS AT HOME:   Prior to Admission medications   Medication Sig Start Date End Date Taking? Authorizing Provider  amLODipine (NORVASC) 5 MG tablet Take 1 tablet (5 mg total) by mouth daily. 11/12/17 11/12/18 Yes Joni ReiningSmith, Ronald K, PA-C  aspirin EC 81 MG tablet Take 81 mg by mouth daily.   Yes [provider]  atorvastatin (LIPITOR) 40 MG tablet Take 1 tablet (40 mg total) by mouth at bedtime. 11/12/17  Yes Joni ReiningSmith, Ronald K, PA-C  gabapentin (NEURONTIN) 300 MG capsule Take 1 capsule (300 mg total) by mouth at bedtime. 12/29/17 12/29/18 Yes McShane, Rudy JewJames A, MD  insulin glargine (LANTUS) 100 UNIT/ML injection Inject 25 Units into the skin at bedtime.   Yes [provider]  insulin lispro (HUMALOG) 100 UNIT/ML injection Inject 0-4 Units into the skin. Per sliding scale   Yes [provider]  losartan (COZAAR) 50 MG tablet Take 1 tablet (50 mg total) by mouth daily. 11/12/17 11/12/18 Yes Joni ReiningSmith, Ronald K, PA-C  metFORMIN (GLUCOPHAGE) 500 MG tablet Take 500 mg by mouth 2 (two) times daily.   Yes  [provider]  multivitamin-lutein (OCUVITE-LUTEIN) CAPS capsule Take 1 capsule by mouth daily.   Yes [provider]  cephALEXin (KEFLEX) 500 MG capsule Take 1 capsule (500 mg total) by mouth 2 (two) times daily for 7 days. 09/23/18 09/30/18  Minna Antis, MD  levothyroxine (SYNTHROID, LEVOTHROID) 300 MCG tablet Take 300 mcg by mouth daily before breakfast.    [provider]      VITAL SIGNS:  Blood pressure (!) 143/58, pulse 83,  temperature 98 F (36.7 C), resp. rate (!) 26, height 5' 2.5" (1.588 m), weight 59.4 kg, SpO2 97 %.  PHYSICAL EXAMINATION:  Physical Exam  GENERAL:  84 y.o.-year-old patient lying in the bed with no acute distress.  EYES: Pupils equal, round, reactive to light and accommodation. No scleral icterus. Extraocular muscles intact.  HEENT: Head atraumatic, normocephalic. Oropharynx and nasopharynx clear.  Moist mucous membranes. NECK:  Supple, no jugular venous distention. No thyroid enlargement, no tenderness.  LUNGS: Normal breath sounds bilaterally, no wheezing, rales,rhonchi or crepitation. No use of accessory muscles of respiration.  CARDIOVASCULAR: RRR, S1, S2 normal. No murmurs, rubs, or gallops.  ABDOMEN: Soft, nontender, nondistended. Bowel sounds present. No organomegaly or mass.  EXTREMITIES: No pedal edema, cyanosis, or clubbing. + Medial and lateral joint line tenderness in both knees bilaterally.  No erythema or edema. + Tenderness with range of motion of both hips. NEUROLOGIC: Cranial nerves II through XII are intact. Muscle strength 5/5 in all extremities. Sensation intact. Gait not checked.  PSYCHIATRIC: The patient is alert and oriented x 3.  SKIN: No obvious rash, lesion, or ulcer.   LABORATORY PANEL:   CBC Recent Labs  Lab 09/23/18 0759  WBC 11.2*  HGB 13.6  HCT 40.3  PLT 252   ------------------------------------------------------------------------------------------------------------------  Chemistries  Recent Labs  Lab 09/23/18 0759  NA 138  K 3.9  CL 101  CO2 27  GLUCOSE 171*  BUN 19  CREATININE 0.82  CALCIUM 9.0  AST 26  ALT 18  ALKPHOS 93  BILITOT 1.4*   ------------------------------------------------------------------------------------------------------------------  Cardiac Enzymes Recent Labs  Lab 09/23/18 0759  TROPONINI <0.03    ------------------------------------------------------------------------------------------------------------------  RADIOLOGY:  Dg Knee Complete 4 Views Right  Result Date: 09/23/2018 CLINICAL DATA:  Fall.  Right knee pain.  Initial encounter. EXAM: RIGHT KNEE - COMPLETE 4+ VIEW COMPARISON:  None. FINDINGS: No evidence of fracture, dislocation, or joint effusion. No evidence of arthropathy or other focal bone abnormality. Generalized osteopenia and peripheral vascular calcification noted. Soft tissues are unremarkable. IMPRESSION: No acute findings. Osteopenia and peripheral vascular calcification. Electronically Signed   By: Myles Rosenthal M.D.   On: 09/23/2018 08:02   Dg Hip Unilat  With Pelvis 2-3 Views Right  Result Date: 09/23/2018 CLINICAL DATA:  Initial encounter for Fall from bed this morning as well as yesterday morning. Approx 3 foot fall. Patient states right hip, leg, groin, and knee pain following fall. Hx of stroke. EXAM: DG HIP (WITH OR WITHOUT PELVIS) 2-3V RIGHT COMPARISON:  None. FINDINGS: Femoral heads are located. Vascular calcifications. Osteopenia. No acute fracture. IMPRESSION: No acute osseous abnormality. Electronically Signed   By: Jeronimo Greaves M.D.   On: 09/23/2018 08:02      IMPRESSION AND PLAN:   UTI- UA consistent with infection, although patient denies any urinary symptoms. WBC only mildly elevated to 11.2 with no other signs of infection or sepsis, so may just be asymptomatic bacteriuria. -Will continue ceftriaxone for now -Follow-up urine culture -Encourage po intake  Mechanical fall- due  to weakness and chronic bilateral hip and knee pain (likely osteoarthritis). Chronic leg pain may also be due to PVD due to multiple risk factors and peripheral vascular calcifications seen on right knee x-ray -Will obtain bilateral hip and knee x-rays -PT/OT consult -Consider ABIs and vascular surgery referral as an outpatient  Hypertension- BPs mildly elevated in the  ED -Continue home norvasc and losartan  Type 2 diabetes- hyperglycemic in the ED, recent A1c 7.9 -Lantus 18 units qhs -Moderate SSI  Hypothyroidism- stable, recent TSH normal -Continue home synthroid  Hyperlipidemia- stable -Continue home lipitor  History of stroke- no residual deficits -Continue aspirin and lipitor  All the records are reviewed and case discussed with ED provider. Management plans discussed with the patient, family and they are in agreement.  CODE STATUS: DNR  TOTAL TIME TAKING CARE OF THIS PATIENT: 45 minutes.    Jinny Blossom  M.D on 09/23/2018 at 11:38 AM  Between 7am to 6pm - Pager - [phone]  After 6pm go to [email or web address] - Social research officer, government  [facility]  Office  [phone]  CC: Primary care physician; Barbette Reichmann, MD   Note: This dictation was prepared with Dragon dictation along with smaller phrase technology. Any transcriptional errors that result from this process are unintentional.

## 2018-09-23 NOTE — ED Notes (Addendum)
Awaiting atb to finish prior to discharge

## 2018-09-23 NOTE — Care Management Obs Status (Signed)
MEDICARE OBSERVATION STATUS NOTIFICATION   Patient Details  Name: Madison Barnett MRN: [id number] Date of Birth: 1933/03/15   Medicare Observation Status Notification Given:  Yes    Kendel Bessey A Zea Kostka, RN 09/23/2018, 2:01 PM

## 2018-09-23 NOTE — Plan of Care (Signed)
The patient is very weak from the lower extremities. No falls during this shift. Bed alarm on. Son want to be present tomorrow when PT/OT see the patient in order to talk to them.  No skin breakdown shown. Urinary incontinent.  Problem: Education: Goal: Knowledge of General Education information will improve Description Including pain rating scale, medication(s)/side effects and non-pharmacologic comfort measures Outcome: Progressing   Problem: Health Behavior/Discharge Planning: Goal: Ability to manage health-related needs will improve Outcome: Progressing   Problem: Clinical Measurements: Goal: Ability to maintain clinical measurements within normal limits will improve Outcome: Progressing Goal: Will remain free from infection Outcome: Progressing Goal: Diagnostic test results will improve Outcome: Progressing Goal: Respiratory complications will improve Outcome: Progressing Goal: Cardiovascular complication will be avoided Outcome: Progressing   Problem: Activity: Goal: Risk for activity intolerance will decrease Outcome: Progressing   Problem: Nutrition: Goal: Adequate nutrition will be maintained Outcome: Progressing   Problem: Coping: Goal: Level of anxiety will decrease Outcome: Progressing   Problem: Elimination: Goal: Will not experience complications related to bowel motility Outcome: Progressing Goal: Will not experience complications related to urinary retention Outcome: Progressing   Problem: Pain Managment: Goal: General experience of comfort will improve Outcome: Progressing   Problem: Safety: Goal: Ability to remain free from injury will improve Outcome: Progressing   Problem: Skin Integrity: Goal: Risk for impaired skin integrity will decrease Outcome: Progressing

## 2018-09-24 LAB — BASIC METABOLIC PANEL
Anion gap: 8 (ref 5–15)
BUN: 22 mg/dL (ref 8–23)
CO2: 25 mmol/L (ref 22–32)
Calcium: 8.6 mg/dL — ABNORMAL LOW (ref 8.9–10.3)
Chloride: 103 mmol/L (ref 98–111)
Creatinine, Ser: 0.96 mg/dL (ref 0.44–1.00)
GFR calc Af Amer: >60 mL/min (ref >60)
GFR, EST NON AFRICAN AMERICAN: 55 mL/min — AB (ref >60)
Glucose, Bld: 169 mg/dL — ABNORMAL HIGH (ref 70–99)
POTASSIUM: 3.6 mmol/L (ref 3.5–5.1)
Sodium: 136 mmol/L (ref 135–145)

## 2018-09-24 LAB — CBC
HCT: 41.6 % (ref 36.0–46.0)
HEMOGLOBIN: 13.4 g/dL (ref 12.0–15.0)
MCH: 31.2 pg (ref 26.0–34.0)
MCHC: 32.2 g/dL (ref 30.0–36.0)
MCV: 97 fL (ref 80.0–100.0)
Platelets: 270 10*3/uL (ref 150–400)
RBC: 4.29 MIL/uL (ref 3.87–5.11)
RDW: 11.7 % (ref 11.5–15.5)
WBC: 8.8 10*3/uL (ref 4.0–10.5)
nRBC: 0 % (ref 0.0–0.2)

## 2018-09-24 LAB — GLUCOSE, CAPILLARY
GLUCOSE-CAPILLARY: 152 mg/dL — AB (ref 70–99)
GLUCOSE-CAPILLARY: 169 mg/dL — AB (ref 70–99)
Glucose-Capillary: 168 mg/dL — ABNORMAL HIGH (ref 70–99)
Glucose-Capillary: 185 mg/dL — ABNORMAL HIGH (ref 70–99)

## 2018-09-24 LAB — TSH: TSH: 6.225 u[IU]/mL — ABNORMAL HIGH (ref 0.350–4.500)

## 2018-09-24 MED ORDER — SODIUM CHLORIDE 0.9 % IV SOLN
1.0000 g | INTRAVENOUS | Status: DC
  Administered 2018-09-24: 1 g via INTRAVENOUS
  Filled 2018-09-24: qty 1
  Filled 2018-09-24 (×2): qty 10

## 2018-09-24 NOTE — [place] FL2 (Signed)
[place] MEDICAID FL2 LEVEL OF CARE SCREENING TOOL     IDENTIFICATION  Patient Name: Madison Barnett Birthdate: 07-Apr-1933 Sex: female Admission Date (Current Location): 09/23/2018  [place]   Facility and Address:  [facility], [address], [place], [place] [address]      Provider Number: [id number]  Attending Physician Name and Address:  Enedina FinnerPatel, Sona, MD  Relative Name and Phone Number:       Current Level of Care: Hospital Recommended Level of Care: Skilled Nursing Facility Prior Approval Number:    Date Approved/Denied:   PASRR Number:    Discharge Plan: SNF    Current Diagnoses: Patient Active Problem List   Diagnosis Date Noted  . UTI (urinary tract infection) 09/23/2018    Orientation RESPIRATION BLADDER Height & Weight     Self, Time, Situation, Place  Normal Incontinent Weight: 144 lb 10 oz (65.6 kg) Height:  5\' 2"  (157.5 cm)  BEHAVIORAL SYMPTOMS/MOOD NEUROLOGICAL BOWEL NUTRITION STATUS  (none) (none) Continent Diet(heart healthy/carb modified)  AMBULATORY STATUS COMMUNICATION OF NEEDS Skin   Extensive Assist Verbally Normal                       Personal Care Assistance Level of Assistance  Bathing, Dressing Bathing Assistance: Limited assistance   Dressing Assistance: Limited assistance     Functional Limitations Info  (none)          SPECIAL CARE FACTORS FREQUENCY  PT (By licensed PT)                    Contractures Contractures Info: Not present    Additional Factors Info  Code Status, Allergies Code Status Info: none Allergies Info: nka           Current Medications (09/24/2018):  This is the current hospital active medication list Current Facility-Administered Medications  Medication Dose Route Frequency Provider Last Rate Last Dose  . acetaminophen (TYLENOL) tablet 650 mg  650 mg Oral Q6H PRN Campbell StallMayo, Katy Dodd, MD   650 mg at 09/23/18 2026   Or  .  acetaminophen (TYLENOL) suppository 650 mg  650 mg Rectal Q6H PRN Mayo, Allyn KennerKaty Dodd, MD      . amLODipine (NORVASC) tablet 5 mg  5 mg Oral Daily Mayo, Allyn KennerKaty Dodd, MD   5 mg at 09/24/18 0809  . aspirin EC tablet 81 mg  81 mg Oral Daily Mayo, Allyn KennerKaty Dodd, MD   81 mg at 09/24/18 0809  . atorvastatin (LIPITOR) tablet 40 mg  40 mg Oral QHS Mayo, Allyn KennerKaty Dodd, MD   40 mg at 09/23/18 2026  . cefTRIAXone (ROCEPHIN) 1 g in sodium chloride 0.9 % 100 mL IVPB  1 g Intravenous Q24H Mayo, Allyn KennerKaty Dodd, MD      . enoxaparin (LOVENOX) injection 40 mg  40 mg Subcutaneous Q24H Mayo, Allyn KennerKaty Dodd, MD   40 mg at 09/23/18 1726  . gabapentin (NEURONTIN) capsule 300 mg  300 mg Oral QHS Mayo, Allyn KennerKaty Dodd, MD   300 mg at 09/23/18 2027  . insulin aspart (novoLOG) injection 0-15 Units  0-15 Units Subcutaneous TID WC Mayo, Allyn KennerKaty Dodd, MD   3 Units at 09/24/18 [id number]  . insulin aspart (novoLOG) injection 0-5 Units  0-5 Units Subcutaneous QHS Mayo, Allyn KennerKaty Dodd, MD      . insulin glargine (LANTUS) injection 18 Units  18 Units Subcutaneous QHS Campbell StallMayo, Katy Dodd, MD   18 Units at 09/23/18 2152  .  levothyroxine (SYNTHROID, LEVOTHROID) tablet 300 mcg  300 mcg Oral QAC breakfast Mayo, Allyn Kenner, MD      . losartan (COZAAR) tablet 50 mg  50 mg Oral Daily Mayo, Allyn Kenner, MD   50 mg at 09/24/18 0809  . ondansetron (ZOFRAN) tablet 4 mg  4 mg Oral Q6H PRN Mayo, Allyn Kenner, MD       Or  . ondansetron [facility]) injection 4 mg  4 mg Intravenous Q6H PRN Mayo, Allyn Kenner, MD      . polyethylene glycol (MIRALAX / GLYCOLAX) packet 17 g  17 g Oral Daily PRN Mayo, Allyn Kenner, MD         Discharge Medications: Please see discharge summary for a list of discharge medications.  Relevant Imaging Results:  Relevant Lab Results:   Additional Information ss: [id number]  York Spaniel, LCSW

## 2018-09-24 NOTE — Evaluation (Signed)
Occupational Therapy Evaluation Patient Details Name: Madison Barnett MRN: [id number] DOB: 08-21-1933 Today's Date: 09/24/2018    History of Present Illness Pt is an 84 y.o. female with a known history of hypertension, type 2 diabetes, hyperlipidemia, hypothyroidism, history of stroke who presented to the ED with bilateral leg pain and recent mechanical falls x 2.   She stated this was due to generalized weakness and chronic bilateral hip and knee pain.  She did not hit her head.  She did not lose consciousness.  She denied any fevers, chills, or feeling bad.  She denies any dysuria, frequency, urgency, or suprapubic abdominal pain.  Labs were significant for WBC 11.2 and UA with large leukocytes, many bacteria, >50 RBCs.  She was given a dose of ceftriaxone.  Hospitalists were called for admission.  Assessment includes:  possible left inferior pubic rami fracture per imaging report, UTI, chronic leg pain, HTN, DM II, hypothyroidism, HLD, and h/o stroke.     Clinical Impression   Pt seen for OT evaluation this date. Prior to hospital admission, pt was ambulating short household distances with a RW and was receiving assist from family for bathing/dressing as needed.  Pt lives with her son and daughter in law. Pt is also a [facility] program participant, and per son, attends 3x/wk. Currently pt demonstrates impairments in strength, pain, balance, and ROM impairing her ability to perform mobility and ADL tasks at baseline level of assist. Pt required MOD A for bed mobility, transfers, and 2 small side steps EOB with VC for RW use. Pt requires MAX A for LB ADL and MIN A for UB ADL. Pt would benefit from skilled OT to address noted impairments and functional limitations (see below for any additional details) in order to maximize safety and independence while minimizing falls risk and caregiver burden.  Upon hospital discharge, recommend pt discharge to STR.    Follow Up Recommendations  SNF    Equipment  Recommendations  3 in 1 bedside commode    Recommendations for Other Services       Precautions / Restrictions Precautions Precautions: Fall Restrictions Weight Bearing Restrictions: Yes LLE Weight Bearing: Weight bearing as tolerated      Mobility Bed Mobility Overal bed mobility: Needs Assistance Bed Mobility: Supine to Sit;Sit to Supine     Supine to sit: Mod assist;HOB elevated Sit to supine: Mod assist   General bed mobility comments: Mod verbal and tactile cues for sequencing  Transfers Overall transfer level: Needs assistance Equipment used: Rolling walker (2 wheeled) Transfers: Sit to/from Stand Sit to Stand: Mod assist;From elevated surface         General transfer comment: VC for hand/foot placement and use of RW    Balance Overall balance assessment: Needs assistance Sitting-balance support: No upper extremity supported;Feet supported Sitting balance-Leahy Scale: Good     Standing balance support: Bilateral upper extremity supported Standing balance-Leahy Scale: Fair                             ADL either performed or assessed with clinical judgement   ADL Overall ADL's : Needs assistance/impaired Eating/Feeding: Sitting;Independent   Grooming: Sitting;Independent   Upper Body Bathing: Sitting;Minimal assistance   Lower Body Bathing: Sitting/lateral leans;Maximal assistance   Upper Body Dressing : Sitting;Minimal assistance   Lower Body Dressing: Sitting/lateral leans;Maximal assistance   Toilet Transfer: RW;Moderate assistance;BSC;Stand-pivot  Vision Patient Visual Report: No change from baseline       Perception     Praxis      Pertinent Vitals/Pain Pain Assessment: Faces Pain Score: 5  Faces Pain Scale: Hurts whole lot Pain Location: groin, medial aspect of B knees, and anterior pelvis Pain Descriptors / Indicators: Aching;Sore Pain Intervention(s): Limited activity within patient's  tolerance;Monitored during session;Repositioned     Hand Dominance     Extremity/Trunk Assessment Upper Extremity Assessment Upper Extremity Assessment: Overall WFL for tasks assessed   Lower Extremity Assessment Lower Extremity Assessment: Defer to PT evaluation;Generalized weakness(difficult to fully assess 2/2 pain) RLE: Unable to fully assess due to pain LLE: Unable to fully assess due to pain       Communication Communication Communication: No difficulties   Cognition Arousal/Alertness: Awake/alert Behavior During Therapy: WFL for tasks assessed/performed Overall Cognitive Status: Within Functional Limits for tasks assessed                                     General Comments       Exercises     Shoulder Instructions      Home Living Family/patient expects to be discharged to:: Private residence Living Arrangements: Children Available Help at Discharge: Family;Available 24 hours/day;Other (Comment)(Pt attends [facility] program 3x/wk) Type of Home: House Home Access: Stairs to enter [employer] of Steps: 4 Entrance Stairs-Rails: Right;Left Home Layout: Two level;Able to live on main level with bedroom/bathroom Alternate Level Stairs-Number of Steps: Pt has never been up to 2nd floor of the home   Bathroom Shower/Tub: Tub/shower unit   Bathroom Toilet: Standard     Home Equipment: Environmental consultant - 2 wheels;Shower seat          Prior Functioning/Environment Level of Independence: Needs assistance  Gait / Transfers Assistance Needed: SBA with amb with a RW limited community distances, occasional min A with transfers and bed mobility, multiple recent falls in the last year with pt unsure of exact number ADL's / Homemaking Assistance Needed: Pt receives assistance with bathing and dressing from daughter-in-law            OT Problem List: Decreased strength;Decreased knowledge of use of DME or AE;Decreased range of motion;Impaired balance  (sitting and/or standing);Pain      OT Treatment/Interventions: Self-care/ADL training;Balance training;Therapeutic exercise;Therapeutic activities;DME and/or AE instruction;Patient/family education    OT Goals(Current goals can be found in the care plan section) Acute Rehab OT Goals Patient Stated Goal: have less pain and walk again OT Goal Formulation: With patient Time For Goal Achievement: 10/08/18 Potential to Achieve Goals: Good ADL Goals Pt Will Perform Lower Body Dressing: sit to/from stand;with mod assist;with min assist;with adaptive equipment Pt Will Transfer to Toilet: with min guard assist;ambulating;bedside commode(LRAD for amb) Additional ADL Goal #1: Pt/family will verbalize plan to implement at least 1 learned falls prevention strategy in the home to minimize falls risk.  OT Frequency: Min 1X/week   Barriers to D/C: Inaccessible home environment          Co-evaluation              AM-PAC OT "6 Clicks" Daily Activity     Outcome Measure Help from another person eating meals?: None Help from another person taking care of personal grooming?: None Help from another person toileting, which includes using toliet, bedpan, or urinal?: A Lot Help from another person bathing (including washing, rinsing, drying)?: A Lot  Help from another person to put on and taking off regular upper body clothing?: A Little Help from another person to put on and taking off regular lower body clothing?: A Lot 6 Click Score: 17   End of Session Equipment Utilized During Treatment: Gait belt;Rolling walker  Activity Tolerance: Patient limited by pain;Patient tolerated treatment well Patient left: in bed;with call bell/phone within reach;with bed alarm set;with nursing/sitter in room;with family/visitor present  OT Visit Diagnosis: Other abnormalities of gait and mobility (R26.89);Repeated falls (R29.6);Muscle weakness (generalized) (M62.81);Pain Pain - Right/Left: Right(both) Pain -  part of body: Knee;Hip                Time: 1610-9604 OT Time Calculation (min): 18 min Charges:  OT General Charges $OT Visit: 1 Visit OT Evaluation $OT Eval Low Complexity: 1 Low  Richrd Prime, MPH, MS, OTR/L ascom [phone] 09/24/18, 12:09 PM

## 2018-09-24 NOTE — Progress Notes (Signed)
[facility] [place] [facility]   PATIENT NAME: Madison Barnett    MR#:  [id number]  DATE OF BIRTH:  September 25, 1933  SUBJECTIVE:  patient overall doing well. She had complained of hip pain when she worked with physical therapy earlier. She is eating lunch. Denies any other complaints. Quite weak.  REVIEW OF SYSTEMS:   Review of Systems  Constitutional: Negative for chills, fever and weight loss.  HENT: Negative for ear discharge, ear pain and nosebleeds.   Eyes: Negative for blurred vision, pain and discharge.  Respiratory: Negative for sputum production, shortness of breath, wheezing and stridor.   Cardiovascular: Negative for chest pain, palpitations, orthopnea and PND.  Gastrointestinal: Negative for abdominal pain, diarrhea, nausea and vomiting.  Genitourinary: Positive for dysuria. Negative for frequency and urgency.  Musculoskeletal: Positive for falls and joint pain. Negative for back pain.  Neurological: Negative for sensory change, speech change, focal weakness and weakness.  Psychiatric/Behavioral: Negative for depression and hallucinations. The patient is not nervous/anxious.    Tolerating Diet:yes Tolerating PT: rehab  DRUG ALLERGIES:  No Known Allergies  VITALS:  Blood pressure (!) 144/62, pulse 75, temperature 98.8 F (37.1 C), temperature source Oral, resp. rate 18, height 5\' 2"  (1.575 m), weight 65.6 kg, SpO2 99 %.  PHYSICAL EXAMINATION:   Physical Exam  GENERAL:  84 y.o.-year-old patient lying in the bed with no acute distress.  EYES: Pupils equal, round, reactive to light and accommodation. No scleral icterus. Extraocular muscles intact.  HEENT: Head atraumatic, normocephalic. Oropharynx and nasopharynx clear.  NECK:  Supple, no jugular venous distention. No thyroid enlargement, no tenderness.  LUNGS: Normal breath sounds bilaterally, no wheezing, rales, rhonchi. No use of accessory muscles of respiration.  CARDIOVASCULAR: S1, S2  normal. No murmurs, rubs, or gallops.  ABDOMEN: Soft, nontender, nondistended. Bowel sounds present. No organomegaly or mass.  EXTREMITIES: No cyanosis, clubbing or edema b/l.    NEUROLOGIC: Cranial nerves II through XII are intact. No focal Motor or sensory deficits b/l.   PSYCHIATRIC:  patient is alert and oriented x 3.  SKIN: No obvious rash, lesion, or ulcer.   LABORATORY PANEL:  CBC Recent Labs  Lab 09/24/18 0423  WBC 8.8  HGB 13.4  HCT 41.6  PLT 270    Chemistries  Recent Labs  Lab 09/23/18 0759 09/24/18 0423  NA 138 136  K 3.9 3.6  CL 101 103  CO2 27 25  GLUCOSE 171* 169*  BUN 19 22  CREATININE 0.82 0.96  CALCIUM 9.0 8.6*  AST 26  --   ALT 18  --   ALKPHOS 93  --   BILITOT 1.4*  --    Cardiac Enzymes Recent Labs  Lab 09/23/18 0759  TROPONINI <0.03   RADIOLOGY:  Dg Knee Complete 4 Views Left  Result Date: 09/23/2018 CLINICAL DATA:  Left hip pain EXAM: LEFT KNEE - COMPLETE 4+ VIEW COMPARISON:  None. FINDINGS: Generalized osteopenia. No acute fracture or dislocation. Mild osteoarthritis of the medial femorotibial compartment. No aggressive osseous lesion. No joint effusion. Peripheral vascular atherosclerotic disease. IMPRESSION: No acute osseous injury of the left knee. Electronically Signed   By: Elige KoHetal  Willson Lipa   On: 09/23/2018 13:37   Dg Knee Complete 4 Views Right  Result Date: 09/23/2018 CLINICAL DATA:  Fall.  Right knee pain.  Initial encounter. EXAM: RIGHT KNEE - COMPLETE 4+ VIEW COMPARISON:  None. FINDINGS: No evidence of fracture, dislocation, or joint effusion. No evidence of arthropathy or other focal bone abnormality.  Generalized osteopenia and peripheral vascular calcification noted. Soft tissues are unremarkable. IMPRESSION: No acute findings. Osteopenia and peripheral vascular calcification. Electronically Signed   By: Myles Rosenthal M.D.   On: 09/23/2018 08:02   Dg Hip Unilat With Pelvis 2-3 Views Left  Result Date: 09/23/2018 CLINICAL DATA:  Left  hip pain. EXAM: DG HIP (WITH OR WITHOUT PELVIS) 2-3V LEFT COMPARISON:  None. FINDINGS: Generalized osteopenia. Subtle lucency through the left inferior pubic ramus on the frog-leg lateral view concerning for a nondisplaced fracture. No other other acute fracture or dislocation. Mild osteoarthritis of bilateral hips. No aggressive osseous lesion. Peripheral vascular atherosclerotic disease. IMPRESSION: Subtle lucency through the left inferior pubic ramus on the frog-leg lateral view concerning for a nondisplaced fracture. Otherwise, no acute hip fracture or dislocation. Given the patient's age and osteopenia, if there is persistent clinical concern for an occult hip fracture, a MRI of the hip is recommended for increased sensitivity. Electronically Signed   By: Elige Ko   On: 09/23/2018 13:41   Dg Hip Unilat  With Pelvis 2-3 Views Right  Result Date: 09/23/2018 CLINICAL DATA:  Initial encounter for Fall from bed this morning as well as yesterday morning. Approx 3 foot fall. Patient states right hip, leg, groin, and knee pain following fall. Hx of stroke. EXAM: DG HIP (WITH OR WITHOUT PELVIS) 2-3V RIGHT COMPARISON:  None. FINDINGS: Femoral heads are located. Vascular calcifications. Osteopenia. No acute fracture. IMPRESSION: No acute osseous abnormality. Electronically Signed   By: Jeronimo Greaves M.D.   On: 09/23/2018 08:02   ASSESSMENT AND PLAN:  Madison Barnett  is a 84 y.o. Madison Barnett with a known history of hypertension, type 2 diabetes, hyperlipidemia, hypothyroidism, history of stroke who presents to the ED with bilateral leg pain and mechanical fall x2.  She fell yesterday and again this morning.  She states this was due to generalized weakness and chronic bilateral hip and knee pain.  *UTI- UA consistent with infection, although patient denies any urinary symptoms. WBC only mildly elevated to 11.2 with no other signs of infection or sepsis, so may just be asymptomatic bacteriuria. -Will continue  ceftriaxone for now--change to kelfex at discharge -Encourage po intake  *Mechanical fall- due to weakness and chronic bilateral hip and knee pain (likely osteoarthritis). Chronic leg pain may also be due to PVD due to multiple risk factors and peripheral vascular calcifications seen on right knee x-ray -Xray hip subtle left inferior pubic rami fracture -PT/OT consult --rehab recomemnded. Just with patient's son Norva Pavlov on the phone. He is okay with her. Patient son tells me will be very difficult for patient to be taken care of at home  *Hypertension- BPs mildly elevated in the ED -Continue home norvasc and losartan  *Type 2 diabetes- hyperglycemic in the ED, recent A1c 7.9 -Lantus 18 units qhs -Moderate SSI  *Hypothyroidism- stable, recent TSH normal -patient refusing to take Synthroid  *Hyperlipidemia- stable -Continue home lipitor  *History of stroke- no residual deficits -Continue aspirin and lipitor  discuss with pace attending Dr. Shelda Pal  Case discussed with Care Management/Social Worker. Management plans discussed with the patient, family and they are in agreement.  CODE STATUS: DNR  DVT Prophylaxis: lovenox  TOTAL TIME TAKING CARE OF THIS PATIENT: *30* minutes.  >50% time spent on counselling and coordination of care  POSSIBLE D/C IN *1-2* DAYS, DEPENDING ON CLINICAL CONDITION.  Note: This dictation was prepared with Dragon dictation along with smaller phrase technology. Any transcriptional errors that result from this  process are unintentional.  Enedina Finner M.D on 09/24/2018 at 2:00 PM  Between 7am to 6pm - Pager - [phone]  After 6pm go to [email or web address] - Social research officer, government  [facility]  Office  [phone]  CC: Primary care physician; Barbette Reichmann, MDPatient ID: Madison Barnett, Madison Barnett   DOB: [birth year], 84 y.o.   MRN: [id number]

## 2018-09-24 NOTE — Plan of Care (Signed)
  Problem: Education: Goal: Knowledge of General Education information will improve Description: Including pain rating scale, medication(s)/side effects and non-pharmacologic comfort measures Outcome: Progressing   Problem: Pain Managment: Goal: General experience of comfort will improve Outcome: Progressing   

## 2018-09-24 NOTE — Evaluation (Signed)
Physical Therapy Evaluation Patient Details Name: Madison Barnett MRN: [id number] DOB: 14-Apr-1933 Today's Date: 09/24/2018   History of Present Illness  Pt is an 84 y.o. female with a known history of hypertension, type 2 diabetes, hyperlipidemia, hypothyroidism, history of stroke who presented to the ED with bilateral leg pain and recent mechanical falls x 2.   She stated this was due to generalized weakness and chronic bilateral hip and knee pain.  She did not hit her head.  She did not lose consciousness.  She denied any fevers, chills, or feeling bad.  She denies any dysuria, frequency, urgency, or suprapubic abdominal pain.  Labs were significant for WBC 11.2 and UA with large leukocytes, many bacteria, >50 RBCs.  She was given a dose of ceftriaxone.  Hospitalists were called for admission.  Assessment includes:  possible left inferior pubic rami fracture per imaging report, UTI, chronic leg pain, HTN, DM II, hypothyroidism, HLD, and h/o stroke.      Clinical Impression  Pt presents with deficits in strength, transfers, mobility, gait, balance, and activity tolerance.  Pt required extensive assistance with both bed mobility and transfers.  Pt required multiple attempts to come to full upright standing position and to remain in standing.  Once in standing pt presented with extreme difficulty advancing her RLE secondary to L hip/groin pain with WB.  With cues and physical assistance the pt was able to take several very small, shuffling steps at the EOB before requiring to return to sitting.  Pt presents with a significant decline in functional mobility compared to her stated baseline and would not be safe to return to her previous living situation at this time.  Pt will benefit from PT services in a SNF setting upon discharge to safely address above deficits for decreased caregiver assistance and eventual return to PLOF.      Follow Up Recommendations SNF;Supervision for mobility/OOB    Equipment  Recommendations  Other (comment)(TBD at next venue of care; if pt unable to discharge to SNF will likely require a RW (is currently borrowing one) and a BSC)    Recommendations for Other Services       Precautions / Restrictions Precautions Precautions: Fall Restrictions Weight Bearing Restrictions: Yes LLE Weight Bearing: Weight bearing as tolerated(Spoke to Dr. Allena KatzPatel regarding imaging impression stating possible L inf pubic rami fracture with Dr. Allena KatzPatel confirming WBAT status)      Mobility  Bed Mobility Overal bed mobility: Needs Assistance Bed Mobility: Supine to Sit;Sit to Supine     Supine to sit: Mod assist Sit to supine: Mod assist   General bed mobility comments: Mod verbal and tactile cues for sequencing  Transfers Overall transfer level: Needs assistance Equipment used: Rolling walker (2 wheeled) Transfers: Sit to/from Stand Sit to Stand: Mod assist;From elevated surface         General transfer comment: Mod verbal cues for sequencing with multiple attempts required to come to full standing position  Ambulation/Gait Ambulation/Gait assistance: Mod assist Gait Distance (Feet): 1 Feet Assistive device: Rolling walker (2 wheeled) Gait Pattern/deviations: Decreased stance time - left;Decreased step length - right;Antalgic;Step-to pattern;Shuffle Gait velocity: Decreased   General Gait Details: Pt highly antalgic on the LLE making advancing the RLE difficult.  Pt only able to take 2-3 very small, shuffling steps at the EOB with Mod A and heavy leaning on the RW.   Stairs            Wheelchair Mobility    Modified Rankin (Stroke Patients  Only)       Balance Overall balance assessment: Needs assistance Sitting-balance support: No upper extremity supported;Feet supported Sitting balance-Leahy Scale: Good     Standing balance support: Bilateral upper extremity supported Standing balance-Leahy Scale: Fair                                Pertinent Vitals/Pain Pain Assessment: 0-10 Pain Score: 5  Pain Location: B hips and knees Pain Descriptors / Indicators: Aching;Sore Pain Intervention(s): Monitored during session;Limited activity within patient's tolerance;Premedicated before session    Home Living Family/patient expects to be discharged to:: Private residence Living Arrangements: Children Available Help at Discharge: Family;Available 24 hours/day;Other (Comment)(Pt attends [facility] program 2x/wk) Type of Home: House Home Access: Stairs to enter Entrance Stairs-Rails: Right;Left(Too wide for both) Entrance Stairs-Number of Steps: 4 Home Layout: Two level;Able to live on main level with bedroom/bathroom Home Equipment: Dan Humphreys - 2 wheels;Shower seat      Prior Function Level of Independence: Needs assistance   Gait / Transfers Assistance Needed: SBA with amb with a RW limited community distances, occasional min A with transfers and bed mobility, multiple falls in the last year with pt unsure of exact number  ADL's / Homemaking Assistance Needed: Pt receives assistance with bathing and dressing from daughter-in-law        Hand Dominance        Extremity/Trunk Assessment   Upper Extremity Assessment Upper Extremity Assessment: Defer to OT evaluation    Lower Extremity Assessment Lower Extremity Assessment: Generalized weakness;RLE deficits/detail;LLE deficits/detail RLE: Unable to fully assess due to pain LLE: Unable to fully assess due to pain       Communication   Communication: No difficulties  Cognition Arousal/Alertness: Awake/alert Behavior During Therapy: WFL for tasks assessed/performed Overall Cognitive Status: Within Functional Limits for tasks assessed                                        General Comments      Exercises     Assessment/Plan    PT Assessment Patient needs continued PT services  PT Problem List Decreased strength;Decreased activity  tolerance;Decreased balance;Decreased knowledge of use of DME;Decreased mobility       PT Treatment Interventions DME instruction;Gait training;Stair training;Functional mobility training;Balance training;Therapeutic exercise;Therapeutic activities;Patient/family education    PT Goals (Current goals can be found in the Care Plan section)  Acute Rehab PT Goals Patient Stated Goal: To walk by myself PT Goal Formulation: With patient Time For Goal Achievement: 10/07/18 Potential to Achieve Goals: Fair    Frequency Min 2X/week   Barriers to discharge Inaccessible home environment      Co-evaluation               AM-PAC PT "6 Clicks" Mobility  Outcome Measure Help needed turning from your back to your side while in a flat bed without using bedrails?: A Lot Help needed moving from lying on your back to sitting on the side of a flat bed without using bedrails?: A Lot Help needed moving to and from a bed to a chair (including a wheelchair)?: A Lot Help needed standing up from a chair using your arms (e.g., wheelchair or bedside chair)?: A Lot Help needed to walk in hospital room?: Total Help needed climbing 3-5 steps with a railing? : Total 6 Click Score: 10  End of Session Equipment Utilized During Treatment: Gait belt Activity Tolerance: Patient limited by pain Patient left: in bed;with call bell/phone within reach;with bed alarm set Nurse Communication: Mobility status PT Visit Diagnosis: Unsteadiness on feet (R26.81);Repeated falls (R29.6);Muscle weakness (generalized) (M62.81);Difficulty in walking, not elsewhere classified (R26.2);Pain Pain - Right/Left: Left Pain - part of body: Hip;Knee    Time: 1610-9604 PT Time Calculation (min) (ACUTE ONLY): 44 min   Charges:   PT Evaluation $PT Eval Low Complexity: 1 Low          D. Scott Shanda Cadotte PT, DPT 09/24/18, 10:57 AM

## 2018-09-24 NOTE — Clinical Social Work Note (Signed)
Clinical Social Work Assessment  Patient Details  Name: Madison Barnett MRN: [id number] Date of Birth: Sep 30, 1933  Date of referral:  09/24/18               Reason for consult:  Facility Placement                Permission sought to share information with:  Facility Medical sales representativeContact Representative, Family Supports Permission granted to share information::  Yes, Verbal Permission Granted  Name::        Agency::     Relationship::     Contact Information:     Housing/Transportation Living arrangements for the past 2 months:  Single Family Home Source of Information:  Patient Patient Interpreter Needed:  None Criminal Activity/Legal Involvement Pertinent to Current Situation/Hospitalization:  No - Comment as needed Significant Relationships:  None Lives with:  Self Do you feel safe going back to the place where you live?  Yes Need for family participation in patient care:  Yes (Comment)  Care giving concerns:  Patient lives at home.    Social Worker assessment / plan:  CSW informed that PT has recommended rehab. CSW spoke with patient at bedside and explained role and purpose of visit. Patient stated that she would talk to her son about considering short term rehab but was agreeable for the referral to be sent out.   [phone] and she is aware now of what PT recommended. She is going to speak with her team and return CSW call in regards to their disposition.  Employment status:    Insurance information:    PT Recommendations:  Skilled Nursing Facility Information / Referral to community resources:     Patient/Family's Response to care:  Patient expressed appreciation for CSW assistance.   Patient/Family's Understanding of and Emotional Response to Diagnosis, Current Treatment, and Prognosis:  Patient is wanting to clarify with her son if rehab is the option she would consider.  Emotional Assessment Appearance:  Appears younger than stated  age Attitude/Demeanor/Rapport:  (pleasant and cooperative) Affect (typically observed):  Accepting, Adaptable, Calm Orientation:  Oriented to Self, Oriented to Place, Oriented to  Time, Oriented to Situation Alcohol / Substance use:  Not Applicable Psych involvement (Current and /or in the community):  No (Comment)  Discharge Needs  Concerns to be addressed:  Care Coordination Readmission within the last 30 days:  No Current discharge risk:  None Barriers to Discharge:  No Barriers Identified   [name], LCSW 09/24/2018, 11:43 AM

## 2018-09-24 NOTE — Progress Notes (Signed)
OT Cancellation Note  Patient Details Name: Madison Barnett MRN: [id number] DOB: July 29, 1933   Cancelled Treatment:    Reason Eval/Treat Not Completed: Other (comment). Order received, chart reviewed. Per PT/RN, pt's son requesting to be present for therapy session. Spoke with RN, son planning to be at the hospital after 10am. Not here yet. Will hold OT evaluation and re-attempt at later time as pt is medically appropriate and when son is present, per family request.   Richrd PrimeJamie Stiller, MPH, MS, OTR/L ascom [phone] 09/24/18, 10:08 AM

## 2018-09-25 LAB — URINE CULTURE

## 2018-09-25 LAB — GLUCOSE, CAPILLARY
Glucose-Capillary: 166 mg/dL — ABNORMAL HIGH (ref 70–99)
Glucose-Capillary: 159 mg/dL — ABNORMAL HIGH (ref 70–99)
Glucose-Capillary: 342 mg/dL — ABNORMAL HIGH (ref 70–99)

## 2018-09-25 MED ORDER — DICLOFENAC SODIUM 1 % TD GEL: 2.0000 g | TRANSDERMAL | 0 refills | Status: AC | PRN

## 2018-09-25 MED ORDER — SODIUM CHLORIDE 0.9 % IV SOLN: INTRAVENOUS | Status: DC | PRN

## 2018-09-25 MED ORDER — CEPHALEXIN 500 MG PO CAPS: 500.0000 mg | ORAL_CAPSULE | Freq: Two times a day (BID) | ORAL | 0 refills | Status: AC

## 2018-09-25 MED ORDER — DOCUSATE SODIUM 100 MG PO CAPS
100.0000 mg | ORAL_CAPSULE | Freq: Two times a day (BID) | ORAL | Status: DC
  Administered 2018-09-25: 100 mg via ORAL
  Filled 2018-09-25: qty 1

## 2018-09-25 MED ORDER — CEPHALEXIN 500 MG PO CAPS
500.0000 mg | ORAL_CAPSULE | Freq: Two times a day (BID) | ORAL | Status: DC
  Administered 2018-09-25: 500 mg via ORAL
  Filled 2018-09-25: qty 1

## 2018-09-25 MED ORDER — DICLOFENAC SODIUM 1 % TD GEL
2.0000 g | TRANSDERMAL | Status: DC | PRN
  Administered 2018-09-25: 2 g via TOPICAL
  Filled 2018-09-25: qty 100

## 2018-09-25 NOTE — Progress Notes (Signed)
Pt prepared for d/c to SNF. IV d/c'd. Skin intact except as charted in most recent assessments. Vitals are stable. Report called to receiving facility. Pt to be transported by [facility].  Madison Barnett [place]

## 2018-09-25 NOTE — Discharge Summary (Signed)
[facility] [place] [facility]   PATIENT NAME: Madison Barnett    MR#:  [id number]  DATE OF BIRTH:  [birth year]  DATE OF ADMISSION:  09/23/2018 ADMITTING PHYSICIAN: Campbell Stall, MD  DATE OF DISCHARGE: 09/25/2018  PRIMARY CARE PHYSICIAN: Barbette Reichmann, MD    ADMISSION DIAGNOSIS:  Lower urinary tract infectious disease [N39.0] Weakness [R53.1] Fall, initial encounter [W19.XXXA] UTI (urinary tract infection) [N39.0]  DISCHARGE DIAGNOSIS:  Mechanical fall--left inferior pubic rami fracture-subtle E coli UTI DJD knees  SECONDARY DIAGNOSIS:   Past Medical History:  Diagnosis Date  . Diabetes mellitus without complication (HCC)   . Hyperlipidemia   . Hypertension   . Stroke [facility])     HOSPITAL COURSE:   Madison Barnett a85 y.o.femalewith a known history of hypertension, type 2 diabetes, hyperlipidemia, hypothyroidism, history of stroke who presents to the ED with bilateral leg pain and mechanical fall x2. She fell yesterday and again this morning. She states this was due to generalized weakness and chronicbilateral hip and knee pain.  *UTI- UA consistent with infection, although patient denies any urinary symptoms. WBC only mildly elevated to 11.2 with no other signs of infection or sepsis -urine culture positive 80,000 colonies of E. coli pan sensitive -change to kelfex at discharge -Encourage po fluid intake  *Mechanical fall- due to weakness and chronic bilateral hip and knee pain (likely osteoarthritis). Chronic leg pain may also be due to PVD due to multiple risk factors and peripheral vascular calcifications seen on right knee x-ray -Xray hip subtle left inferior pubic rami fracture -PT/OT consult --rehab recomemnded. Just with patient's son Madison Barnett on the phone. He is okay with her. Patient son tells me will be very difficult for patient to be taken care of at home  *Hypertension- BPs mildly elevated in the ED -Continue home  norvasc and losartan  *Type 2 diabetes- hyperglycemic in the ED, recent A1c 7.9 -Lantus 18 units qhs -Moderate SSI  *Hypothyroidism- stable, recent TSH normal -follow-up TSH of 6.0 with PCP outpatient  *Hyperlipidemia- stable -Continue home lipitor  *History of stroke- no residual deficits -Continue aspirin and lipitor  discuss with [facility]  attending Dr. Shelda Pal  She will discharge to rehab today CONSULTS OBTAINED:    DRUG ALLERGIES:  No Known Allergies  DISCHARGE MEDICATIONS:   Allergies as of 09/25/2018   No Known Allergies     Medication List    STOP taking these medications   levothyroxine 300 MCG tablet Commonly known as:  SYNTHROID, LEVOTHROID     TAKE these medications   amLODipine 5 MG tablet Commonly known as:  NORVASC Take 1 tablet (5 mg total) by mouth daily.   aspirin EC 81 MG tablet Take 81 mg by mouth daily.   atorvastatin 40 MG tablet Commonly known as:  LIPITOR Take 1 tablet (40 mg total) by mouth at bedtime.   cephALEXin 500 MG capsule Commonly known as:  KEFLEX Take 1 capsule (500 mg total) by mouth every 12 (twelve) hours for 6 days.   diclofenac sodium 1 % Gel Commonly known as:  VOLTAREN Apply 2 g topically as needed (pain).   gabapentin 300 MG capsule Commonly known as:  NEURONTIN Take 1 capsule (300 mg total) by mouth at bedtime.   insulin glargine 100 UNIT/ML injection Commonly known as:  LANTUS Inject 25 Units into the skin at bedtime.   insulin lispro 100 UNIT/ML injection Commonly known as:  HUMALOG Inject 0-4 Units into the skin. Per sliding scale  losartan 50 MG tablet Commonly known as:  COZAAR Take 1 tablet (50 mg total) by mouth daily.   metFORMIN 500 MG tablet Commonly known as:  GLUCOPHAGE Take 500 mg by mouth 2 (two) times daily.   multivitamin-lutein Caps capsule Take 1 capsule by mouth daily.       If you experience worsening of your admission symptoms, develop shortness of breath, life  threatening emergency, suicidal or homicidal thoughts you must seek medical attention immediately by calling 911 or calling your MD immediately  if symptoms less severe.  You Must read complete instructions/literature along with all the possible adverse reactions/side effects for all the Medicines you take and that have been prescribed to you. Take any new Medicines after you have completely understood and accept all the possible adverse reactions/side effects.   Please note  You were cared for by a hospitalist during your hospital stay. If you have any questions about your discharge medications or the care you received while you were in the hospital after you are discharged, you can call the unit and asked to speak with the hospitalist on call if the hospitalist that took care of you is not available. Once you are discharged, your primary care physician will handle any further medical issues. Please note that NO REFILLS for any discharge medications will be authorized once you are discharged, as it is imperative that you return to your primary care physician (or establish a relationship with a primary care physician if you do not have one) for your aftercare needs so that they can reassess your need for medications and monitor your lab values. Today   SUBJECTIVE   C/o knee pain  VITAL SIGNS:  Blood pressure (!) 147/67, pulse 64, temperature 98.4 F (36.9 C), temperature source Oral, resp. rate 18, height 5\' 2"  (1.575 m), weight 65.6 kg, SpO2 100 %.  I/O:    Intake/Output Summary (Last 24 hours) at 09/25/2018 0920 Last data filed at 09/25/2018 0500 Gross per 24 hour  Intake 821.98 ml  Output 1400 ml  Net -578.02 ml    PHYSICAL EXAMINATION:  GENERAL:  84 y.o.-year-old patient lying in the bed with no acute distress.  EYES: Pupils equal, round, reactive to light and accommodation. No scleral icterus. Extraocular muscles intact.  HEENT: Head atraumatic, normocephalic. Oropharynx and  nasopharynx clear.  NECK:  Supple, no jugular venous distention. No thyroid enlargement, no tenderness.  LUNGS: Normal breath sounds bilaterally, no wheezing, rales,rhonchi or crepitation. No use of accessory muscles of respiration.  CARDIOVASCULAR: S1, S2 normal. No murmurs, rubs, or gallops.  ABDOMEN: Soft, non-tender, non-distended. Bowel sounds present. No organomegaly or mass.  EXTREMITIES: No pedal edema, cyanosis, or clubbing.  NEUROLOGIC: Cranial nerves II through XII are intact. Muscle strength 5/5 in all extremities. Sensation intact. Gait not checked.  PSYCHIATRIC: The patient is alert and oriented x 3.  SKIN: No obvious rash, lesion, or ulcer.   DATA REVIEW:   CBC  Recent Labs  Lab 09/24/18 0423  WBC 8.8  HGB 13.4  HCT 41.6  PLT 270    Chemistries  Recent Labs  Lab 09/23/18 0759 09/24/18 0423  NA 138 136  K 3.9 3.6  CL 101 103  CO2 27 25  GLUCOSE 171* 169*  BUN 19 22  CREATININE 0.82 0.96  CALCIUM 9.0 8.6*  AST 26  --   ALT 18  --   ALKPHOS 93  --   BILITOT 1.4*  --     Microbiology Results  Recent Results (from the past 240 hour(s))  Urine Culture     Status: Abnormal   Collection Time: 09/23/18  7:59 AM  Result Value Ref Range Status   Specimen Description   Final    URINE, RANDOM Performed at [facility], [address] [place], [place] [address]    Special Requests   Final    NONE Performed at [facility], [address] [place], [place] [address]    Culture 80,000 COLONIES/mL ESCHERICHIA COLI (A)  Final   Report Status 09/25/2018 FINAL  Final   Organism ID, Bacteria ESCHERICHIA COLI (A)  Final      Susceptibility   Escherichia coli - MIC*    AMPICILLIN <=2 SENSITIVE Sensitive     CEFAZOLIN <=4 SENSITIVE Sensitive     CEFTRIAXONE <=1 SENSITIVE Sensitive     CIPROFLOXACIN <=0.25 SENSITIVE Sensitive     GENTAMICIN 2 SENSITIVE Sensitive     IMIPENEM <=0.25 SENSITIVE Sensitive     NITROFURANTOIN <=16 SENSITIVE  Sensitive     TRIMETH/SULFA <=20 SENSITIVE Sensitive     AMPICILLIN/SULBACTAM <=2 SENSITIVE Sensitive     PIP/TAZO <=4 SENSITIVE Sensitive     Extended ESBL NEGATIVE Sensitive     * 80,000 COLONIES/mL ESCHERICHIA COLI    RADIOLOGY:  Dg Knee Complete 4 Views Left  Result Date: 09/23/2018 CLINICAL DATA:  Left hip pain EXAM: LEFT KNEE - COMPLETE 4+ VIEW COMPARISON:  None. FINDINGS: Generalized osteopenia. No acute fracture or dislocation. Mild osteoarthritis of the medial femorotibial compartment. No aggressive osseous lesion. No joint effusion. Peripheral vascular atherosclerotic disease. IMPRESSION: No acute osseous injury of the left knee. Electronically Signed   By: Elige KoHetal  Emeline Simpson   On: 09/23/2018 13:37   Dg Hip Unilat With Pelvis 2-3 Views Left  Result Date: 09/23/2018 CLINICAL DATA:  Left hip pain. EXAM: DG HIP (WITH OR WITHOUT PELVIS) 2-3V LEFT COMPARISON:  None. FINDINGS: Generalized osteopenia. Subtle lucency through the left inferior pubic ramus on the frog-leg lateral view concerning for a nondisplaced fracture. No other other acute fracture or dislocation. Mild osteoarthritis of bilateral hips. No aggressive osseous lesion. Peripheral vascular atherosclerotic disease. IMPRESSION: Subtle lucency through the left inferior pubic ramus on the frog-leg lateral view concerning for a nondisplaced fracture. Otherwise, no acute hip fracture or dislocation. Given the patient's age and osteopenia, if there is persistent clinical concern for an occult hip fracture, a MRI of the hip is recommended for increased sensitivity. Electronically Signed   By: Elige KoHetal  Rayce Brahmbhatt   On: 09/23/2018 13:41     Management plans discussed with the patient, family and they are in agreement.  CODE STATUS:     Code Status Orders  (From admission, onward)         Start     Ordered   09/23/18 1219  Do not attempt resuscitation (DNR)  Continuous    Question Answer Comment  In the event of cardiac or respiratory ARREST  Do not call a "code blue"   In the event of cardiac or respiratory ARREST Do not perform Intubation, CPR, defibrillation or ACLS   In the event of cardiac or respiratory ARREST Use medication by any route, position, wound care, and other measures to relive pain and suffering. May use oxygen, suction and manual treatment of airway obstruction as needed for comfort.      09/23/18 1219        Code Status History    This patient has a current code status but no historical  code status.    Advance Directive Documentation     Most Recent Value  Type of Advance Directive  Living will, Healthcare Power of Attorney  Pre-existing out of facility DNR order (yellow form or pink MOST form)  -  "MOST" Form in Place?  -      TOTAL TIME TAKING CARE OF THIS PATIENT: *40* minutes.    Enedina Finner M.D on 09/25/2018 at 9:20 AM  Between 7am to 6pm - Pager - [phone] After 6pm go to [email or web address] - Social research officer, government  [facility]  Office  [phone]  CC: Primary care physician; Barbette Reichmann, MD

## 2018-09-25 NOTE — Clinical Social Work Note (Signed)
[facility] is aware that both [facility] and [facility] have offered. [facility] spoke with patient's [facility] and [facility] to transport.  York SpanielMonica Uriyah Raska MSW,LCSW [phone]

## 2020-04-11 ENCOUNTER — Inpatient Hospital Stay
Admission: AD | Admit: 2020-04-11 | Discharge: 2020-04-15 | DRG: 690 | Disposition: A | Discharge status: Skilled Nursing Facility | Payer: Medicare (Managed Care) | Attending: Internal Medicine | Admitting: Internal Medicine

## 2020-04-11 ENCOUNTER — Emergency Department: Payer: Medicare (Managed Care)

## 2020-04-11 ENCOUNTER — Observation Stay: Payer: Medicare (Managed Care)

## 2020-04-11 ENCOUNTER — Other Ambulatory Visit: Payer: Self-pay

## 2020-04-11 DIAGNOSIS — Z794 Long term (current) use of insulin: Secondary | ICD-10-CM

## 2020-04-11 DIAGNOSIS — I10 Essential (primary) hypertension: Secondary | ICD-10-CM | POA: Diagnosis present

## 2020-04-11 DIAGNOSIS — N3000 Acute cystitis without hematuria: Secondary | ICD-10-CM

## 2020-04-11 DIAGNOSIS — E785 Hyperlipidemia, unspecified: Secondary | ICD-10-CM | POA: Diagnosis present

## 2020-04-11 DIAGNOSIS — Z20822 Contact with and (suspected) exposure to covid-19: Secondary | ICD-10-CM | POA: Diagnosis present

## 2020-04-11 DIAGNOSIS — Z7982 Long term (current) use of aspirin: Secondary | ICD-10-CM

## 2020-04-11 DIAGNOSIS — R531 Weakness: Secondary | ICD-10-CM | POA: Diagnosis not present

## 2020-04-11 DIAGNOSIS — E860 Dehydration: Secondary | ICD-10-CM | POA: Diagnosis not present

## 2020-04-11 DIAGNOSIS — R519 Headache, unspecified: Secondary | ICD-10-CM | POA: Diagnosis present

## 2020-04-11 DIAGNOSIS — Z79899 Other long term (current) drug therapy: Secondary | ICD-10-CM

## 2020-04-11 DIAGNOSIS — E1110 Type 2 diabetes mellitus with ketoacidosis without coma: Secondary | ICD-10-CM | POA: Diagnosis not present

## 2020-04-11 DIAGNOSIS — E119 Type 2 diabetes mellitus without complications: Secondary | ICD-10-CM

## 2020-04-11 DIAGNOSIS — B9620 Unspecified Escherichia coli [E. coli] as the cause of diseases classified elsewhere: Secondary | ICD-10-CM | POA: Diagnosis present

## 2020-04-11 DIAGNOSIS — E11649 Type 2 diabetes mellitus with hypoglycemia without coma: Secondary | ICD-10-CM | POA: Diagnosis present

## 2020-04-11 DIAGNOSIS — N3090 Cystitis, unspecified without hematuria: Principal | ICD-10-CM | POA: Diagnosis present

## 2020-04-11 DIAGNOSIS — Z8673 Personal history of transient ischemic attack (TIA), and cerebral infarction without residual deficits: Secondary | ICD-10-CM

## 2020-04-11 DIAGNOSIS — N390 Urinary tract infection, site not specified: Secondary | ICD-10-CM | POA: Diagnosis present

## 2020-04-11 LAB — HEMOGLOBIN A1C
Hgb A1c MFr Bld: 6.9 % — ABNORMAL HIGH (ref 4.8–5.6)
Mean Plasma Glucose: 151.33 mg/dL

## 2020-04-11 LAB — COMPREHENSIVE METABOLIC PANEL
ALT: 14 U/L (ref 0–44)
AST: 31 U/L (ref 15–41)
Albumin: 4.3 g/dL (ref 3.5–5.0)
Alkaline Phosphatase: 79 U/L (ref 38–126)
Anion gap: 11 (ref 5–15)
BUN: 26 mg/dL — ABNORMAL HIGH (ref 8–23)
CO2: 21 mmol/L — ABNORMAL LOW (ref 22–32)
Calcium: 9 mg/dL (ref 8.9–10.3)
Chloride: 99 mmol/L (ref 98–111)
Creatinine, Ser: 0.86 mg/dL (ref 0.44–1.00)
GFR calc Af Amer: >60 mL/min (ref >60)
GFR calc non Af Amer: >60 mL/min (ref >60)
Glucose, Bld: 157 mg/dL — ABNORMAL HIGH (ref 70–99)
Potassium: 4 mmol/L (ref 3.5–5.1)
Sodium: 131 mmol/L — ABNORMAL LOW (ref 135–145)
Total Bilirubin: 1.3 mg/dL — ABNORMAL HIGH (ref 0.3–1.2)
Total Protein: 8.2 g/dL — ABNORMAL HIGH (ref 6.5–8.1)

## 2020-04-11 LAB — CBC WITH DIFFERENTIAL/PLATELET
Abs Immature Granulocytes: 0.04 10*3/uL (ref 0.00–0.07)
Basophils Absolute: 0.1 10*3/uL (ref 0.0–0.1)
Basophils Relative: 1 %
Eosinophils Absolute: 0.3 10*3/uL (ref 0.0–0.5)
Eosinophils Relative: 3 %
HCT: 39.6 % (ref 36.0–46.0)
Hemoglobin: 13.7 g/dL (ref 12.0–15.0)
Immature Granulocytes: 0 %
Lymphocytes Relative: 19 %
Lymphs Abs: 2 10*3/uL (ref 0.7–4.0)
MCH: 31.4 pg (ref 26.0–34.0)
MCHC: 34.6 g/dL (ref 30.0–36.0)
MCV: 90.6 fL (ref 80.0–100.0)
Monocytes Absolute: 1 10*3/uL (ref 0.1–1.0)
Monocytes Relative: 9 %
Neutro Abs: 7.5 10*3/uL (ref 1.7–7.7)
Neutrophils Relative %: 68 %
Platelets: 241 10*3/uL (ref 150–400)
RBC: 4.37 MIL/uL (ref 3.87–5.11)
RDW: 11.5 % (ref 11.5–15.5)
WBC: 10.8 10*3/uL — ABNORMAL HIGH (ref 4.0–10.5)
nRBC: 0 % (ref 0.0–0.2)

## 2020-04-11 LAB — URINALYSIS, COMPLETE (UACMP) WITH MICROSCOPIC
Bilirubin Urine: NEGATIVE
Glucose, UA: 50 mg/dL — AB
Hgb urine dipstick: NEGATIVE
Ketones, ur: NEGATIVE mg/dL
Nitrite: POSITIVE — AB
Protein, ur: 100 mg/dL — AB
Specific Gravity, Urine: 1.011 (ref 1.005–1.030)
Squamous Epithelial / HPF: NONE SEEN (ref 0–5)
WBC, UA: >50 WBC/hpf — ABNORMAL HIGH (ref 0–5)
pH: 6 (ref 5.0–8.0)

## 2020-04-11 LAB — GLUCOSE, CAPILLARY
Glucose-Capillary: 87 mg/dL (ref 70–99)
Glucose-Capillary: 94 mg/dL (ref 70–99)
Glucose-Capillary: 96 mg/dL (ref 70–99)

## 2020-04-11 LAB — PROTIME-INR
INR: 0.9 (ref 0.8–1.2)
Prothrombin Time: 11.7 seconds (ref 11.4–15.2)

## 2020-04-11 LAB — LIPASE, BLOOD: Lipase: 36 U/L (ref 11–51)

## 2020-04-11 LAB — SARS CORONAVIRUS 2 BY RT PCR (HOSPITAL ORDER, PERFORMED IN ~~LOC~~ HOSPITAL LAB): SARS Coronavirus 2: NEGATIVE

## 2020-04-11 MED ORDER — AMLODIPINE BESYLATE 5 MG PO TABS
5.0000 mg | ORAL_TABLET | Freq: Every day | ORAL | Status: DC
  Administered 2020-04-11: 5 mg via ORAL

## 2020-04-11 MED ORDER — ADULT MULTIVITAMIN W/MINERALS CH
1.0000 | ORAL_TABLET | Freq: Every day | ORAL | Status: DC
  Administered 2020-04-12 – 2020-04-15 (×4): 1 via ORAL
  Filled 2020-04-11 (×4): qty 1

## 2020-04-11 MED ORDER — SENNOSIDES-DOCUSATE SODIUM 8.6-50 MG PO TABS
1.0000 | ORAL_TABLET | Freq: Every evening | ORAL | Status: DC | PRN
  Administered 2020-04-15: 1 via ORAL
  Filled 2020-04-11: qty 1

## 2020-04-11 MED ORDER — ATORVASTATIN CALCIUM 20 MG PO TABS
40.0000 mg | ORAL_TABLET | Freq: Every day | ORAL | Status: DC
  Administered 2020-04-11 – 2020-04-14 (×4): 40 mg via ORAL
  Filled 2020-04-11 (×4): qty 2

## 2020-04-11 MED ORDER — SODIUM CHLORIDE 0.9 % IV BOLUS
1000.0000 mL | Freq: Once | INTRAVENOUS | Status: AC
  Administered 2020-04-11: 1000 mL via INTRAVENOUS

## 2020-04-11 MED ORDER — ONDANSETRON HCL 4 MG/2ML IJ SOLN
4.0000 mg | Freq: Once | INTRAMUSCULAR | Status: AC
  Administered 2020-04-11: 4 mg via INTRAVENOUS
  Filled 2020-04-11: qty 2

## 2020-04-11 MED ORDER — ENOXAPARIN SODIUM 40 MG/0.4ML ~~LOC~~ SOLN
40.0000 mg | SUBCUTANEOUS | Status: DC
  Administered 2020-04-11 – 2020-04-14 (×4): 40 mg via SUBCUTANEOUS
  Filled 2020-04-11 (×4): qty 0.4

## 2020-04-11 MED ORDER — INSULIN GLARGINE 100 UNIT/ML ~~LOC~~ SOLN
5.0000 [IU] | Freq: Every day | SUBCUTANEOUS | Status: DC
  Administered 2020-04-11: 5 [IU] via SUBCUTANEOUS
  Filled 2020-04-11 (×2): qty 0.05

## 2020-04-11 MED ORDER — GABAPENTIN 300 MG PO CAPS
300.0000 mg | ORAL_CAPSULE | Freq: Every day | ORAL | Status: DC
  Administered 2020-04-11 – 2020-04-14 (×4): 300 mg via ORAL
  Filled 2020-04-11 (×4): qty 1

## 2020-04-11 MED ORDER — ASPIRIN EC 81 MG PO TBEC
81.0000 mg | DELAYED_RELEASE_TABLET | Freq: Every day | ORAL | Status: DC
  Administered 2020-04-12 – 2020-04-15 (×4): 81 mg via ORAL
  Filled 2020-04-11 (×4): qty 1

## 2020-04-11 MED ORDER — INSULIN ASPART 100 UNIT/ML ~~LOC~~ SOLN
0.0000 [IU] | SUBCUTANEOUS | Status: DC
  Administered 2020-04-13: 7 [IU] via SUBCUTANEOUS
  Administered 2020-04-13: 2 [IU] via SUBCUTANEOUS
  Administered 2020-04-14: 1 [IU] via SUBCUTANEOUS
  Administered 2020-04-14: 3 [IU] via SUBCUTANEOUS
  Filled 2020-04-11 (×4): qty 1

## 2020-04-11 MED ORDER — ACETAMINOPHEN 325 MG PO TABS
650.0000 mg | ORAL_TABLET | Freq: Four times a day (QID) | ORAL | Status: DC | PRN
  Administered 2020-04-11 – 2020-04-12 (×2): 650 mg via ORAL
  Filled 2020-04-11 (×2): qty 2

## 2020-04-11 MED ORDER — ACETAMINOPHEN 650 MG RE SUPP: 650.0000 mg | Freq: Four times a day (QID) | RECTAL | Status: DC | PRN

## 2020-04-11 MED ORDER — SODIUM CHLORIDE 0.9 % IV SOLN: INTRAVENOUS | Status: DC

## 2020-04-11 MED ORDER — QUETIAPINE FUMARATE 25 MG PO TABS
25.0000 mg | ORAL_TABLET | Freq: Every day | ORAL | Status: DC
  Administered 2020-04-11 – 2020-04-14 (×4): 25 mg via ORAL
  Filled 2020-04-11 (×4): qty 1

## 2020-04-11 MED ORDER — OCUVITE-LUTEIN PO CAPS
1.0000 | ORAL_CAPSULE | Freq: Every evening | ORAL | Status: DC
  Administered 2020-04-12 – 2020-04-14 (×3): 1 via ORAL
  Filled 2020-04-11 (×4): qty 1

## 2020-04-11 MED ORDER — HYDRALAZINE HCL 20 MG/ML IJ SOLN: 10.0000 mg | Freq: Four times a day (QID) | INTRAMUSCULAR | Status: DC | PRN

## 2020-04-11 MED ORDER — SODIUM CHLORIDE 0.9 % IV SOLN
1.0000 g | Freq: Once | INTRAVENOUS | Status: AC
  Administered 2020-04-11: 1 g via INTRAVENOUS
  Filled 2020-04-11: qty 10

## 2020-04-11 NOTE — ED Triage Notes (Signed)
Pt comes EMS from home with h/a, weakness, and foul odor urine for a few days. AOX4 for EMS. Hx of UTIs.

## 2020-04-11 NOTE — ED Provider Notes (Signed)
[facility] Emergency Department Provider Note  ____________________________________________  Time seen: Approximately 3:30 PM  I have reviewed the triage vital signs and the nursing notes.   HISTORY  Chief Complaint Weakness  Patient speaks English and Filipino  HPI Madison Barnett is a 86 y.o. female with a history of hypertension diabetes and stroke who comes the ED complaining of  generalized weakness, nausea, decreased oral intake for the past 2 days associated with dysuria and frequency.  Denies headache chest pain shortness of breath.  Had a normal bowel movement yesterday.  No vomiting.  Symptoms are constant, worsening without aggravating or alleviating factors.     Past Medical History:  Diagnosis Date  . Diabetes mellitus without complication ([place])   . Hyperlipidemia   . Hypertension   . Stroke [facility])      Patient Active Problem List   Diagnosis Date Noted  . UTI (urinary tract infection) 09/23/2018     History reviewed. No pertinent surgical history.   Prior to Admission medications   Medication Sig Start Date End Date Taking? Authorizing Provider  amLODipine (NORVASC) 5 MG tablet Take 1 tablet (5 mg total) by mouth daily. 11/12/17 11/12/18  Sable Feil, PA-C  aspirin EC 81 MG tablet Take 81 mg by mouth daily.    [provider]  atorvastatin (LIPITOR) 40 MG tablet Take 1 tablet (40 mg total) by mouth at bedtime. 11/12/17   Sable Feil, PA-C  diclofenac sodium (VOLTAREN) 1 % GEL Apply 2 g topically as needed (pain). 09/25/18   Fritzi Mandes, MD  gabapentin (NEURONTIN) 300 MG capsule Take 1 capsule (300 mg total) by mouth at bedtime. 12/29/17 12/29/18  Schuyler Amor, MD  insulin glargine (LANTUS) 100 UNIT/ML injection Inject 25 Units into the skin at bedtime.    [provider]  insulin lispro (HUMALOG) 100 UNIT/ML injection Inject 0-4 Units into the skin. Per sliding scale    [provider]  losartan  (COZAAR) 50 MG tablet Take 1 tablet (50 mg total) by mouth daily. 11/12/17 11/12/18  Sable Feil, PA-C  metFORMIN (GLUCOPHAGE) 500 MG tablet Take 500 mg by mouth 2 (two) times daily.    [provider]  multivitamin-lutein (OCUVITE-LUTEIN) CAPS capsule Take 1 capsule by mouth daily.    [provider]     Allergies Patient has no known allergies.   History reviewed. No pertinent family history.  Social History Social History   Tobacco Use  . Smoking status: Never Smoker  . Smokeless tobacco: Never Used  Substance Use Topics  . Alcohol use: No  . Drug use: No    Review of Systems  Constitutional:   No fever or chills.  ENT:   No sore throat. No rhinorrhea. Cardiovascular:   No chest pain or syncope. Respiratory:   No dyspnea or cough. Gastrointestinal:   Negative for abdominal pain, vomiting and diarrhea.  Positive nausea and dysuria Musculoskeletal:   Negative for focal pain or swelling All other systems reviewed and are negative except as documented above in ROS and HPI.  ____________________________________________   PHYSICAL EXAM:  VITAL SIGNS: ED Triage Vitals  Enc Vitals Group     BP 04/11/20 1428 (!) 157/94     Pulse Rate 04/11/20 1428 90     Resp 04/11/20 1428 18     Temp 04/11/20 1424 99.2 F (37.3 C)     Temp Source 04/11/20 1424 Oral     SpO2 04/11/20 1428 96 %  Weight 04/11/20 1424 143 lb 4.8 oz (65 kg)     Height 04/11/20 1424 5\' 4"  (1.626 m)     Head Circumference --      [facility] Flow --      Pain Score 04/11/20 1424 0     Pain Loc --      Pain Edu? --      Excl. in GC? --     Vital signs reviewed, nursing assessments reviewed.   Constitutional:   Alert and oriented.  Ill-appearing Eyes:   Conjunctivae are normal. EOMI. PERRL. ENT      Head:   Normocephalic and atraumatic.      Nose: Normal.      Mouth/Throat:   Dry mucous membranes      Neck:   No meningismus. Full ROM. Hematological/Lymphatic/Immunilogical:   No  cervical lymphadenopathy. Cardiovascular:   RRR. Symmetric bilateral radial and DP pulses.  No murmurs. Cap refill 3 to 4 seconds. Respiratory:   Normal respiratory effort without tachypnea/retractions. Breath sounds are clear and equal bilaterally. No wheezes/rales/rhonchi. Gastrointestinal:   Soft with suprapubic tenderness. Non distended. There is no CVA tenderness.  No rebound, rigidity, or guarding.  Musculoskeletal:   Normal range of motion in all extremities. No joint effusions.  No lower extremity tenderness.  No edema. Neurologic:   Normal speech and language.  Motor grossly intact. No acute focal neurologic deficits are appreciated.  Skin:    Skin is warm, dry and intact. No rash noted.  No petechiae, purpura, or bullae.  ____________________________________________    LABS (pertinent positives/negatives) (all labs ordered are listed, but only abnormal results are displayed) Labs Reviewed  COMPREHENSIVE METABOLIC PANEL - Abnormal; Notable for the following components:      Result Value   Sodium 131 (*)    CO2 21 (*)    Glucose, Bld 157 (*)    BUN 26 (*)    Total Protein 8.2 (*)    Total Bilirubin 1.3 (*)    All other components within normal limits  CBC WITH DIFFERENTIAL/PLATELET - Abnormal; Notable for the following components:   WBC 10.8 (*)    All other components within normal limits  URINALYSIS, COMPLETE (UACMP) WITH MICROSCOPIC - Abnormal; Notable for the following components:   Color, Urine YELLOW (*)    APPearance CLOUDY (*)    Glucose, UA 50 (*)    Protein, ur 100 (*)    Nitrite POSITIVE (*)    Leukocytes,Ua LARGE (*)    WBC, UA >50 (*)    Bacteria, UA MANY (*)    All other components within normal limits  URINE CULTURE  SARS CORONAVIRUS 2 BY RT PCR (HOSPITAL ORDER, PERFORMED IN [facility])  LIPASE, BLOOD  PROTIME-INR   ____________________________________________   EKG    ____________________________________________     RADIOLOGY  DG Chest Portable 1 View  Result Date: 04/11/2020 CLINICAL DATA:  Weakness, nausea EXAM: PORTABLE CHEST 1 VIEW COMPARISON:  None. FINDINGS: Cardiomegaly. Aortic atherosclerosis. Bibasilar atelectasis or scarring. Mild vascular congestion. No overt edema or effusions. No acute bony abnormality. IMPRESSION: Cardiomegaly, vascular congestion. Bibasilar atelectasis or scarring. Electronically Signed   By: 04/13/2020 M.D.   On: 04/11/2020 15:21    ____________________________________________   PROCEDURES Procedures  ____________________________________________  DIFFERENTIAL DIAGNOSIS   Electrolyte abnormality, dehydration, AKI, cystitis, pneumonia  CLINICAL IMPRESSION / ASSESSMENT AND PLAN / ED COURSE  Medications ordered in the ED: Medications  cefTRIAXone (ROCEPHIN) 1 g in sodium chloride 0.9 % 100 mL  IVPB (has no administration in time range)  sodium chloride 0.9 % bolus 1,000 mL (1,000 mLs Intravenous New Bag/Given 04/11/20 1446)  ondansetron (ZOFRAN) injection 4 mg (4 mg Intravenous Given 04/11/20 1447)    Pertinent labs & imaging results that were available during my care of the patient were reviewed by me and considered in my medical decision making (see chart for details).  Madison Barnett was evaluated in Emergency Department on 04/11/2020 for the symptoms described in the history of present illness. She was evaluated in the context of the global COVID-19 pandemic, which necessitated consideration that the patient might be at risk for infection with the SARS-CoV-2 virus that causes COVID-19. Institutional protocols and algorithms that pertain to the evaluation of patients at risk for COVID-19 are in a state of rapid change based on information released by regulatory bodies including the CDC and federal and state organizations. These policies and algorithms were followed during the patient's care in the ED.   Patient presents with generalized weakness, dysuria, malaise  for the past few days.  Appears dehydrated.  Will check labs, chest x-ray, urinalysis.  Give IV fluids for hydration.  Vital signs are normal, she is not septic.  ----------------------------------------- 3:32 PM on 04/11/2020 ----------------------------------------- UA consistent with UTI.  Will give IV ceftriaxone and plan to hospitalize for initial antibiotic therapy and hydration given age, comorbidities with diabetes, functional decline in the setting of acute illness.  Labs reassuring but consistent with dehydration.      ____________________________________________   FINAL CLINICAL IMPRESSION(S) / ED DIAGNOSES    Final diagnoses:  Cystitis  Generalized weakness  Dehydration  Type 2 diabetes mellitus without complication, with long-term current use of insulin [facility])     ED Discharge Orders    None      Portions of this note were generated with dragon dictation software. Dictation errors may occur despite best attempts at proofreading.   Sharman Cheek, MD 04/11/20 1534

## 2020-04-11 NOTE — H&P (Signed)
History and Physical    Madison Barnett WFU:[id number] DOB: 1933/06/09 DOA: 04/11/2020  PCP: Tracie Harrier, MD  Patient coming from: Home   Chief Complaint: HA, weakness  HPI: Madison Barnett is a 86 y.o. female with medical history significant of Madison Barnett is a 86 y.o. female with a history of hypertension, DL,  diabetes and stroke who comes the ED complaining of generalized weakness, nausea, decreased oral intake for the past 2 days associated with dysuria and frequency per ER. Pt is sleepy and somewhat poor historian as she is sleepy. She reports feeling tired, currently c/o HA, no Nausea, cp, sob, fever, or chills.  I spoke to family , pt has been c/o HA and appeared disoriented. They felt due to the HA causing disorientation. They reported nausea too.  ED Course: In the ED her temperature was 99.2, blood pressure 157/94, pulse 90 respiratory rate 18.  Chest x-ray with cardiomegaly and vascular congestion.  Bibasilar atelectasis or scarring.  Sodium 131, creatinine 0.86, BUN 26, WBC 10.8.  Covid testing pending.  Urinalysis positive.  In the ED she was given ceftriaxon and ivf.  Review of Systems: All systems reviewed and otherwise negative.    Past Medical History:  Diagnosis Date  . Diabetes mellitus without complication ([place])   . Hyperlipidemia   . Hypertension   . Stroke [facility])     History reviewed. No pertinent surgical history.   reports that she has never smoked. She has never used smokeless tobacco. She reports that she does not drink alcohol and does not use drugs.  No Known Allergies  History reviewed. No pertinent family history.   Prior to Admission medications   Medication Sig Start Date End Date Taking? Authorizing Provider  amLODipine (NORVASC) 5 MG tablet Take 1 tablet (5 mg total) by mouth daily. 11/12/17 11/12/18  Sable Feil, PA-C  aspirin EC 81 MG tablet Take 81 mg by mouth daily.    [provider]  atorvastatin (LIPITOR) 40 MG tablet Take 1  tablet (40 mg total) by mouth at bedtime. 11/12/17   Sable Feil, PA-C  diclofenac sodium (VOLTAREN) 1 % GEL Apply 2 g topically as needed (pain). 09/25/18   Fritzi Mandes, MD  gabapentin (NEURONTIN) 300 MG capsule Take 1 capsule (300 mg total) by mouth at bedtime. 12/29/17 12/29/18  Schuyler Amor, MD  insulin glargine (LANTUS) 100 UNIT/ML injection Inject 25 Units into the skin at bedtime.    [provider]  insulin lispro (HUMALOG) 100 UNIT/ML injection Inject 0-4 Units into the skin. Per sliding scale    [provider]  losartan (COZAAR) 50 MG tablet Take 1 tablet (50 mg total) by mouth daily. 11/12/17 11/12/18  Sable Feil, PA-C  metFORMIN (GLUCOPHAGE) 500 MG tablet Take 500 mg by mouth 2 (two) times daily.    [provider]  multivitamin-lutein (OCUVITE-LUTEIN) CAPS capsule Take 1 capsule by mouth daily.    [provider]    Physical Exam: Vitals:   04/11/20 1424 04/11/20 1428  BP:  (!) 157/94  Pulse:  90  Resp:  18  Temp: 99.2 F (37.3 C)   TempSrc: Oral   SpO2:  96%  Weight: 65 kg   Height: 5\' 4"  (1.626 m)     Constitutional: NAD, calm, comfortable Vitals:   04/11/20 1424 04/11/20 1428  BP:  (!) 157/94  Pulse:  90  Resp:  18  Temp: 99.2 F (37.3 C)   TempSrc: Oral  SpO2:  96%  Weight: 65 kg   Height: 5\' 4"  (1.626 m)    Eyes: eomi ENMT: Mucous membranes dry somewhat Neck: normal, supple Respiratory: clear to auscultation bilaterally, no wheezing, no crackles. Normal respiratory effort. No accessory muscle use.  Cardiovascular: Regular rate and rhythm, no murmurs / rubs / gallops. No extremity edema. Abdomen: no tenderness, no masses palpated.Bowel sounds positive.  Musculoskeletal: no clubbing / cyanosis. No joint deformity upper and lower extremities.  Skin: no rashes, lesions, ulcers. No induration Neurologic: CN 2-12 grossly intact. Unable to do strenght as pt kept falling asleep Psychiatric: oriented x 2, not to date.  Falls asleep during exam.     Labs on Admission: I have personally reviewed following labs and imaging studies  CBC: Recent Labs  Lab 04/11/20 1432  WBC 10.8*  NEUTROABS 7.5  HGB 13.7  HCT 39.6  MCV 90.6  PLT 241   Basic Metabolic Panel: Recent Labs  Lab 04/11/20 1432  NA 131*  K 4.0  CL 99  CO2 21*  GLUCOSE 157*  BUN 26*  CREATININE 0.86  CALCIUM 9.0   GFR: Estimated Creatinine Clearance: 41.3 mL/min (by C-G formula based on SCr of 0.86 mg/dL). Liver Function Tests: Recent Labs  Lab 04/11/20 1432  AST 31  ALT 14  ALKPHOS 79  BILITOT 1.3*  PROT 8.2*  ALBUMIN 4.3   Recent Labs  Lab 04/11/20 1432  LIPASE 36   No results for input(s): AMMONIA in the last 168 hours. Coagulation Profile: Recent Labs  Lab 04/11/20 1432  INR 0.9   Cardiac Enzymes: No results for input(s): CKTOTAL, CKMB, CKMBINDEX, TROPONINI in the last 168 hours. BNP (last 3 results) No results for input(s): PROBNP in the last 8760 hours. HbA1C: No results for input(s): HGBA1C in the last 72 hours. CBG: No results for input(s): GLUCAP in the last 168 hours. Lipid Profile: No results for input(s): CHOL, HDL, LDLCALC, TRIG, CHOLHDL, LDLDIRECT in the last 72 hours. Thyroid Function Tests: No results for input(s): TSH, T4TOTAL, FREET4, T3FREE, THYROIDAB in the last 72 hours. Anemia Panel: No results for input(s): VITAMINB12, FOLATE, FERRITIN, TIBC, IRON, RETICCTPCT in the last 72 hours. Urine analysis:    Component Value Date/Time   COLORURINE YELLOW (A) 04/11/2020 1501   APPEARANCEUR CLOUDY (A) 04/11/2020 1501   LABSPEC 1.011 04/11/2020 1501   PHURINE 6.0 04/11/2020 1501   GLUCOSEU 50 (A) 04/11/2020 1501   HGBUR NEGATIVE 04/11/2020 1501   BILIRUBINUR NEGATIVE 04/11/2020 1501   KETONESUR NEGATIVE 04/11/2020 1501   PROTEINUR 100 (A) 04/11/2020 1501   NITRITE POSITIVE (A) 04/11/2020 1501   LEUKOCYTESUR LARGE (A) 04/11/2020 1501    Radiological Exams on Admission: DG Chest  Portable 1 View  Result Date: 04/11/2020 CLINICAL DATA:  Weakness, nausea EXAM: PORTABLE CHEST 1 VIEW COMPARISON:  None. FINDINGS: Cardiomegaly. Aortic atherosclerosis. Bibasilar atelectasis or scarring. Mild vascular congestion. No overt edema or effusions. No acute bony abnormality. IMPRESSION: Cardiomegaly, vascular congestion. Bibasilar atelectasis or scarring. Electronically Signed   By: 04/13/2020 M.D.   On: 04/11/2020 15:21    EKG: Independently reviewed. pending  Assessment/Plan Active Problems:   * No active hospital problems. *   1. UTI- Positive UA on admission, WBC elevated Was started on ceftriaxone will continue Follow-up urine culture  2. Weakness/decrease po intake Likely from infection from UTI.  Will start gentle ivf for hydration Place on carb diet  3.HA Reported by family and pt. bp elevated currently, unsure if bp causing HA or HA  elevating it. Will give bp meds  Will obtain CT head to r/o any etiology if any Neurologically she appears intact.   4. Essential HTN- elevated currently.  Will resume home meds once verified by pharmacy Iv hydralazine prn  5. DM type 2 on insulin Decrease home Lantus to 5 units as she has decrease po intake riss Ck fs Hypoglycemic protocal  DVT prophylaxis: lovenox Code Status: full, confirmed with son  Family Communication: discussed with son and daughter inlaw  Disposition Plan: back to previous home life  Consults called: none  Admission status: observation as she requires <2 MN stays    Lynn Ito MD [facility] Pager [phone]   If 7PM-7AM, please contact night-coverage [email or web address] Password [facility]  04/11/2020, 3:54 PM

## 2020-04-11 NOTE — Plan of Care (Signed)
Continue with plan of care.  

## 2020-04-12 DIAGNOSIS — Z7982 Long term (current) use of aspirin: Secondary | ICD-10-CM | POA: Diagnosis not present

## 2020-04-12 DIAGNOSIS — E860 Dehydration: Secondary | ICD-10-CM | POA: Diagnosis present

## 2020-04-12 DIAGNOSIS — N3090 Cystitis, unspecified without hematuria: Secondary | ICD-10-CM | POA: Diagnosis present

## 2020-04-12 DIAGNOSIS — B9620 Unspecified Escherichia coli [E. coli] as the cause of diseases classified elsewhere: Secondary | ICD-10-CM | POA: Diagnosis present

## 2020-04-12 DIAGNOSIS — G44209 Tension-type headache, unspecified, not intractable: Secondary | ICD-10-CM | POA: Diagnosis not present

## 2020-04-12 DIAGNOSIS — Z794 Long term (current) use of insulin: Secondary | ICD-10-CM | POA: Diagnosis not present

## 2020-04-12 DIAGNOSIS — E118 Type 2 diabetes mellitus with unspecified complications: Secondary | ICD-10-CM | POA: Diagnosis not present

## 2020-04-12 DIAGNOSIS — R531 Weakness: Secondary | ICD-10-CM | POA: Diagnosis not present

## 2020-04-12 DIAGNOSIS — E785 Hyperlipidemia, unspecified: Secondary | ICD-10-CM | POA: Diagnosis present

## 2020-04-12 DIAGNOSIS — R519 Headache, unspecified: Secondary | ICD-10-CM | POA: Diagnosis present

## 2020-04-12 DIAGNOSIS — I10 Essential (primary) hypertension: Secondary | ICD-10-CM | POA: Diagnosis present

## 2020-04-12 DIAGNOSIS — Z8673 Personal history of transient ischemic attack (TIA), and cerebral infarction without residual deficits: Secondary | ICD-10-CM | POA: Diagnosis not present

## 2020-04-12 DIAGNOSIS — N3000 Acute cystitis without hematuria: Secondary | ICD-10-CM | POA: Diagnosis not present

## 2020-04-12 DIAGNOSIS — R627 Adult failure to thrive: Secondary | ICD-10-CM | POA: Diagnosis not present

## 2020-04-12 DIAGNOSIS — E11649 Type 2 diabetes mellitus with hypoglycemia without coma: Secondary | ICD-10-CM | POA: Diagnosis present

## 2020-04-12 DIAGNOSIS — Z20822 Contact with and (suspected) exposure to covid-19: Secondary | ICD-10-CM | POA: Diagnosis present

## 2020-04-12 DIAGNOSIS — Z79899 Other long term (current) drug therapy: Secondary | ICD-10-CM | POA: Diagnosis not present

## 2020-04-12 LAB — BASIC METABOLIC PANEL
Anion gap: 10 (ref 5–15)
BUN: 20 mg/dL (ref 8–23)
CO2: 23 mmol/L (ref 22–32)
Calcium: 8.6 mg/dL — ABNORMAL LOW (ref 8.9–10.3)
Chloride: 104 mmol/L (ref 98–111)
Creatinine, Ser: 0.95 mg/dL (ref 0.44–1.00)
GFR calc Af Amer: >60 mL/min (ref >60)
GFR calc non Af Amer: 55 mL/min — ABNORMAL LOW (ref >60)
Glucose, Bld: 130 mg/dL — ABNORMAL HIGH (ref 70–99)
Potassium: 3.8 mmol/L (ref 3.5–5.1)
Sodium: 137 mmol/L (ref 135–145)

## 2020-04-12 LAB — GLUCOSE, CAPILLARY
Glucose-Capillary: 58 mg/dL — ABNORMAL LOW (ref 70–99)
Glucose-Capillary: 68 mg/dL — ABNORMAL LOW (ref 70–99)
Glucose-Capillary: 215 mg/dL — ABNORMAL HIGH (ref 70–99)
Glucose-Capillary: 67 mg/dL — ABNORMAL LOW (ref 70–99)
Glucose-Capillary: 88 mg/dL (ref 70–99)
Glucose-Capillary: 114 mg/dL — ABNORMAL HIGH (ref 70–99)
Glucose-Capillary: 122 mg/dL — ABNORMAL HIGH (ref 70–99)

## 2020-04-12 LAB — CBC
HCT: 36 % (ref 36.0–46.0)
Hemoglobin: 12.4 g/dL (ref 12.0–15.0)
MCH: 30.9 pg (ref 26.0–34.0)
MCHC: 34.4 g/dL (ref 30.0–36.0)
MCV: 89.8 fL (ref 80.0–100.0)
Platelets: 206 10*3/uL (ref 150–400)
RBC: 4.01 MIL/uL (ref 3.87–5.11)
RDW: 11.9 % (ref 11.5–15.5)
WBC: 8.5 10*3/uL (ref 4.0–10.5)
nRBC: 0 % (ref 0.0–0.2)

## 2020-04-12 MED ORDER — AMLODIPINE BESYLATE 10 MG PO TABS
10.0000 mg | ORAL_TABLET | Freq: Every day | ORAL | Status: DC
  Administered 2020-04-12 – 2020-04-15 (×4): 10 mg via ORAL
  Filled 2020-04-12 (×4): qty 1

## 2020-04-12 MED ORDER — LOSARTAN POTASSIUM 25 MG PO TABS
25.0000 mg | ORAL_TABLET | Freq: Every day | ORAL | Status: DC
  Administered 2020-04-12 – 2020-04-15 (×4): 25 mg via ORAL
  Filled 2020-04-12 (×4): qty 1

## 2020-04-12 MED ORDER — SODIUM CHLORIDE 0.9 % IV SOLN
1.0000 g | INTRAVENOUS | Status: DC
  Administered 2020-04-12 – 2020-04-14 (×3): 1 g via INTRAVENOUS
  Filled 2020-04-12 (×2): qty 1
  Filled 2020-04-12: qty 10

## 2020-04-12 MED ORDER — DEXTROSE-NACL 5-0.45 % IV SOLN: INTRAVENOUS | Status: DC

## 2020-04-12 MED ORDER — DEXTROSE 50 % IV SOLN
INTRAVENOUS | Status: AC
  Administered 2020-04-12: 50 mL
  Filled 2020-04-12: qty 50

## 2020-04-12 NOTE — Plan of Care (Signed)
Continuing with plan of care. 

## 2020-04-12 NOTE — Progress Notes (Signed)
Patient blood sugar was 58. Gave patient Orange juice PO . Recheck blood sugar is now 68. Gave patient orange again.

## 2020-04-12 NOTE — Progress Notes (Signed)
PT Cancellation Note  Patient Details Name: Madison Barnett MRN: [id number] DOB: 1933/01/09   Cancelled Treatment:    Reason Eval/Treat Not Completed: Patient's level of consciousness Chart reviewed, spoke with nurse who reports that she is still pretty confused and lethargic, states "Doubt you will get much out of her."  Went to check in with family in room and Spanish speaking staff confirms that pt will not be able to participate with PT today.  Spoke with son who reports that [facility] has been trying to get increased help in the home but that things have been getting harder and harder to manage.  Per family report it does seem likely that pt will need actual rehab facility when d/c from the hospital, hope pt's mental status allows PT exam tomorrow.  Malachi Pro, DPT 04/12/2020, 2:30 PM

## 2020-04-12 NOTE — Progress Notes (Signed)
PROGRESS NOTE    Madison Barnett  VOH:[id number] DOB: 20-Sep-1933 DOA: 04/11/2020 PCP: Barbette Reichmann, MD    Brief Narrative:  Madison Barnett is a 86 y.o. female with medical history significant of Madison Barnett a 86 y.o.femalewith a history of hypertension, DL,  diabetes and stroke who comes the ED complaining ofgeneralized weakness, nausea, decreased oral intake for the past 2 days associated with dysuria and frequency per ER. Pt is sleepy and somewhat poor historian as she is sleepy. She reports feeling tired, currently c/o HA, no Nausea, cp, sob, fever, or chills.  I spoke to family , pt has been c/o HA and appeared disoriented. They felt due to the HA causing disorientation. They reported nausea too.    Consultants:     Procedures:   Antimicrobials:   ceftriaxon    Subjective: Was sundowning, aggitated, was given seroquel over night, now sleeping hardly can open her eyes. Daughter at bedside. No other issues  Objective: Vitals:   04/11/20 1800 04/11/20 1947 04/12/20 0428 04/12/20 1139  BP: (!) 141/71 140/72 133/60 (!) 114/55  Pulse: 85 79 62 61  Resp: 14 20 20 14   Temp: 98.4 F (36.9 C) 98 F (36.7 C) (!) 97.4 F (36.3 C) 98.7 F (37.1 C)  TempSrc: Oral Oral Oral Oral  SpO2: 97% 97% 98% 97%  Weight:      Height:        Intake/Output Summary (Last 24 hours) at 04/12/2020 1258 Last data filed at 04/12/2020 0916 Gross per 24 hour  Intake 684.57 ml  Output 2 ml  Net 682.57 ml   Filed Weights   04/11/20 1424  Weight: 65 kg    Examination:  General exam: Appears calm and comfortable, sleeping in bed, daughter at bedside  respiratory system: Clear to auscultation.  Poor respiratory effort normal.  No wheezing Cardiovascular system: S1 & S2 heard, RRR. No JVD, murmurs, rubs, gallops or clicks. Gastrointestinal system: Abdomen is nondistended, soft and nontender. Normal bowel sounds heard. Central nervous system: Sleepy unable to keep eyes open unable to  assess  Extremities: No edema Skin: Warm dry Psychiatry: Unable to assess     Data Reviewed: I have personally reviewed following labs and imaging studies  CBC: Recent Labs  Lab 04/11/20 1432 04/12/20 0914  WBC 10.8* 8.5  NEUTROABS 7.5  --   HGB 13.7 12.4  HCT 39.6 36.0  MCV 90.6 89.8  PLT 241 206   Basic Metabolic Panel: Recent Labs  Lab 04/11/20 1432 04/12/20 0914  NA 131* 137  K 4.0 3.8  CL 99 104  CO2 21* 23  GLUCOSE 157* 130*  BUN 26* 20  CREATININE 0.86 0.95  CALCIUM 9.0 8.6*   GFR: Estimated Creatinine Clearance: 37.4 mL/min (by C-G formula based on SCr of 0.95 mg/dL). Liver Function Tests: Recent Labs  Lab 04/11/20 1432  AST 31  ALT 14  ALKPHOS 79  BILITOT 1.3*  PROT 8.2*  ALBUMIN 4.3   Recent Labs  Lab 04/11/20 1432  LIPASE 36   No results for input(s): AMMONIA in the last 168 hours. Coagulation Profile: Recent Labs  Lab 04/11/20 1432  INR 0.9   Cardiac Enzymes: No results for input(s): CKTOTAL, CKMB, CKMBINDEX, TROPONINI in the last 168 hours. BNP (last 3 results) No results for input(s): PROBNP in the last 8760 hours. HbA1C: Recent Labs    04/11/20 1600  HGBA1C 6.9*   CBG: Recent Labs  Lab 04/12/20 0425 04/12/20 04/14/20 04/12/20 0747 04/12/20 0810 04/12/20  1138  GLUCAP 58* 68* 67* 215* 88   Lipid Profile: No results for input(s): CHOL, HDL, LDLCALC, TRIG, CHOLHDL, LDLDIRECT in the last 72 hours. Thyroid Function Tests: No results for input(s): TSH, T4TOTAL, FREET4, T3FREE, THYROIDAB in the last 72 hours. Anemia Panel: No results for input(s): VITAMINB12, FOLATE, FERRITIN, TIBC, IRON, RETICCTPCT in the last 72 hours. Sepsis Labs: No results for input(s): PROCALCITON, LATICACIDVEN in the last 168 hours.  Recent Results (from the past 240 hour(s))  SARS Coronavirus 2 by RT PCR (hospital order, performed in [facility] hospital lab) Nasopharyngeal Nasopharyngeal Swab     Status: None   Collection Time: 04/11/20  3:01 PM    Specimen: Nasopharyngeal Swab  Result Value Ref Range Status   SARS Coronavirus 2 NEGATIVE NEGATIVE Final    Comment: (NOTE) SARS-CoV-2 target nucleic acids are NOT DETECTED.  The SARS-CoV-2 RNA is generally detectable in upper and lower respiratory specimens during the acute phase of infection. The lowest concentration of SARS-CoV-2 viral copies this assay can detect is 250 copies / mL. A negative result does not preclude SARS-CoV-2 infection and should not be used as the sole basis for treatment or other patient management decisions.  A negative result may occur with improper specimen collection / handling, submission of specimen other than nasopharyngeal swab, presence of viral mutation(s) within the areas targeted by this assay, and inadequate number of viral copies (<250 copies / mL). A negative result must be combined with clinical observations, patient history, and epidemiological information.  Fact Sheet for Patients:   BoilerBrush.com.cy  Fact Sheet for Healthcare Providers: https://pope.com/  This test is not yet approved or  cleared by the [place] FDA and has been authorized for detection and/or diagnosis of SARS-CoV-2 by FDA under an Emergency Use Authorization (EUA).  This EUA will remain in effect (meaning this test can be used) for the duration of the COVID-19 declaration under Section 564(b)(1) of the Act, 21 U.S.C. section 360bbb-3(b)(1), unless the authorization is terminated or revoked sooner.  Performed at [facility], [address] [place], [place] [address]          Radiology Studies: CT HEAD WO CONTRAST  Result Date: 04/11/2020 CLINICAL DATA:  Headache EXAM: CT HEAD WITHOUT CONTRAST TECHNIQUE: Contiguous axial images were obtained from the base of the skull through the vertex without intravenous contrast. COMPARISON:  None. FINDINGS: Brain: There is atrophy and chronic small vessel  disease changes. No acute intracranial abnormality. Specifically, no hemorrhage, hydrocephalus, mass lesion, acute infarction, or significant intracranial injury. Vascular: No hyperdense vessel or unexpected calcification. Skull: No acute calvarial abnormality. Sinuses/Orbits: Visualized paranasal sinuses and mastoids clear. Orbital soft tissues unremarkable. Other: None IMPRESSION: Atrophy, chronic microvascular disease. No acute intracranial abnormality. Electronically Signed   By: Charlett Nose M.D.   On: 04/11/2020 17:44   DG Chest Portable 1 View  Result Date: 04/11/2020 CLINICAL DATA:  Weakness, nausea EXAM: PORTABLE CHEST 1 VIEW COMPARISON:  None. FINDINGS: Cardiomegaly. Aortic atherosclerosis. Bibasilar atelectasis or scarring. Mild vascular congestion. No overt edema or effusions. No acute bony abnormality. IMPRESSION: Cardiomegaly, vascular congestion. Bibasilar atelectasis or scarring. Electronically Signed   By: Charlett Nose M.D.   On: 04/11/2020 15:21        Scheduled Meds: . amLODipine  10 mg Oral Daily  . aspirin EC  81 mg Oral Daily  . atorvastatin  40 mg Oral QHS  . enoxaparin (LOVENOX) injection  40 mg Subcutaneous Q24H  . gabapentin  300 mg Oral QHS  .  insulin aspart  0-9 Units Subcutaneous Q4H  . losartan  25 mg Oral Daily  . multivitamin with minerals  1 tablet Oral Daily  . multivitamin-lutein  1 capsule Oral QPM  . QUEtiapine  25 mg Oral QHS   Continuous Infusions: . sodium chloride 50 mL/hr at 04/11/20 1815  . cefTRIAXone (ROCEPHIN)  IV      Assessment & Plan:   Active Problems:   UTI (urinary tract infection)   1. UTI- Positive UA on admission Afebrile today, WBC decreased Continue ceftriaxone  Follow-up urine culture pending  2. Weakness/decrease po intake-still not eating.  Was hypoglycemic this AM. Likely from infection from UTI.  Continue gentle hydration but will switch to D5 half for IV fluids as she has been hypoglycemic Place on carb  diet   3.HA Reported by family and pt. bp elevated currently, unsure if bp causing HA or HA elevating it. BP improving CT of the head without acute abnormalities    4. Essential HTN- elevated currently.  Iv hydralazine prn Increase amlodipine to 10 mg daily Start losartan home dose at a lower dose at 25 mg daily  5. DM type 2 on insulin Will DC standing dose Lantus as she has not been hypoglycemic this a.m. given OJ with improvement We will switch IV fluids to D5 half riss Ck fs Hypoglycemic protocal  DVT prophylaxis: lovenox Code Status: full Family Communication:  Daughter at bedside updated with assessment and plans Disposition Plan: back to previous home life  versus rehab PT evaluation pending Barrier: Still weak and unable to take p.o. intake and receiving IV antibiotics also needs PT evaluation Anticipated discharge to be determined when medically stable as above      LOS: 0 days   Time spent: 45 min with >50% on coc    Nolberto Hanlon, MD [facility] Pager [phone]  If 7PM-7AM, please contact night-coverage [email or web address] Password [facility] 04/12/2020, 12:58 PM

## 2020-04-13 DIAGNOSIS — E118 Type 2 diabetes mellitus with unspecified complications: Secondary | ICD-10-CM

## 2020-04-13 LAB — GLUCOSE, CAPILLARY
Glucose-Capillary: 120 mg/dL — ABNORMAL HIGH (ref 70–99)
Glucose-Capillary: 127 mg/dL — ABNORMAL HIGH (ref 70–99)
Glucose-Capillary: 169 mg/dL — ABNORMAL HIGH (ref 70–99)
Glucose-Capillary: 228 mg/dL — ABNORMAL HIGH (ref 70–99)
Glucose-Capillary: 259 mg/dL — ABNORMAL HIGH (ref 70–99)
Glucose-Capillary: 316 mg/dL — ABNORMAL HIGH (ref 70–99)

## 2020-04-13 LAB — URINE CULTURE: Culture: >=100000 — AB

## 2020-04-13 NOTE — Progress Notes (Signed)
PROGRESS NOTE    Madison Barnett  ZOX:[id number] DOB: December 12, 1932 DOA: 04/11/2020 PCP: Barbette Reichmann, MD    Brief Narrative:  Madison Barnett is a 86 y.o. female with medical history significant of shefali ng a 86 y.o.femalewith a history of hypertension, DL,  diabetes and stroke who comes the ED complaining ofgeneralized weakness, nausea, decreased oral intake for the past 2 days associated with dysuria and frequency per ER. Pt is sleepy and somewhat poor historian as she is sleepy. She reports feeling tired, currently c/o HA, no Nausea, cp, sob, fever, or chills.  I spoke to family , pt has been c/o HA and appeared disoriented. They felt due to the HA causing disorientation. They reported nausea too.    Consultants:     Procedures:   Antimicrobials:   ceftriaxon    Subjective: Patient is less sleepy and more awake and alert and interactive with me today.  Denies shortness of breath, chest pain, abdominal pain.  Does report feeling a little better.  Objective: Vitals:   04/12/20 2007 04/12/20 2007 04/13/20 0537 04/13/20 1123  BP: (!) 146/56  (!) 141/53 (!) 157/65  Pulse: 74  68 80  Resp: 18  16 16   Temp:  98.2 F (36.8 C) 98 F (36.7 C) 98.2 F (36.8 C)  TempSrc:  Oral Oral Oral  SpO2: 97%  95% 98%  Weight:      Height:        Intake/Output Summary (Last 24 hours) at 04/13/2020 1601 Last data filed at 04/13/2020 1300 Gross per 24 hour  Intake 534.98 ml  Output 0 ml  Net 534.98 ml   Filed Weights   04/11/20 1424  Weight: 65 kg    Examination:  General exam: Appears calm and comfortable,, more interactive this a.m. respiratory system: Clear to auscultation.  Poor respiratory effort normal.  No wheezing, Rales, rhonchi Cardiovascular system: S1 & S2 heard, RRR. No JVD, murmurs, rubs, gallops or clicks. Gastrointestinal system: Abdomen is nondistended, soft and nontender. Normal bowel sounds heard.  No rebound Central nervous system: Grossly intact, alert  and awake Extremities: No edema Skin: Warm dry Psychiatry: Mood and affect normal in current setting    Data Reviewed: I have personally reviewed following labs and imaging studies  CBC: Recent Labs  Lab 04/11/20 1432 04/12/20 0914  WBC 10.8* 8.5  NEUTROABS 7.5  --   HGB 13.7 12.4  HCT 39.6 36.0  MCV 90.6 89.8  PLT 241 206   Basic Metabolic Panel: Recent Labs  Lab 04/11/20 1432 04/12/20 0914  NA 131* 137  K 4.0 3.8  CL 99 104  CO2 21* 23  GLUCOSE 157* 130*  BUN 26* 20  CREATININE 0.86 0.95  CALCIUM 9.0 8.6*   GFR: Estimated Creatinine Clearance: 37.4 mL/min (by C-G formula based on SCr of 0.95 mg/dL). Liver Function Tests: Recent Labs  Lab 04/11/20 1432  AST 31  ALT 14  ALKPHOS 79  BILITOT 1.3*  PROT 8.2*  ALBUMIN 4.3   Recent Labs  Lab 04/11/20 1432  LIPASE 36   No results for input(s): AMMONIA in the last 168 hours. Coagulation Profile: Recent Labs  Lab 04/11/20 1432  INR 0.9   Cardiac Enzymes: No results for input(s): CKTOTAL, CKMB, CKMBINDEX, TROPONINI in the last 168 hours. BNP (last 3 results) No results for input(s): PROBNP in the last 8760 hours. HbA1C: Recent Labs    04/11/20 1600  HGBA1C 6.9*   CBG: Recent Labs  Lab 04/12/20 2007 04/13/20  0533 04/13/20 0741 04/13/20 1125 04/13/20 1521  GLUCAP 122* 120* 127* 316* 169*   Lipid Profile: No results for input(s): CHOL, HDL, LDLCALC, TRIG, CHOLHDL, LDLDIRECT in the last 72 hours. Thyroid Function Tests: No results for input(s): TSH, T4TOTAL, FREET4, T3FREE, THYROIDAB in the last 72 hours. Anemia Panel: No results for input(s): VITAMINB12, FOLATE, FERRITIN, TIBC, IRON, RETICCTPCT in the last 72 hours. Sepsis Labs: No results for input(s): PROCALCITON, LATICACIDVEN in the last 168 hours.  Recent Results (from the past 240 hour(s))  Urine culture     Status: Abnormal   Collection Time: 04/11/20  3:01 PM   Specimen: Urine, Catheterized  Result Value Ref Range Status    Specimen Description   Final    URINE, CATHETERIZED Performed at [facility], [address] [place], [place] [address]    Special Requests   Final    NONE Performed at [facility], [address] [place], [place] [address]    Culture >=100,000 COLONIES/mL ESCHERICHIA COLI (A)  Final   Report Status 04/13/2020 FINAL  Final   Organism ID, Bacteria ESCHERICHIA COLI (A)  Final      Susceptibility   Escherichia coli - MIC*    AMPICILLIN >=32 RESISTANT Resistant     CEFAZOLIN <=4 SENSITIVE Sensitive     CEFTRIAXONE <=0.25 SENSITIVE Sensitive     CIPROFLOXACIN <=0.25 SENSITIVE Sensitive     GENTAMICIN <=1 SENSITIVE Sensitive     IMIPENEM <=0.25 SENSITIVE Sensitive     NITROFURANTOIN <=16 SENSITIVE Sensitive     TRIMETH/SULFA <=20 SENSITIVE Sensitive     AMPICILLIN/SULBACTAM 16 INTERMEDIATE Intermediate     PIP/TAZO <=4 SENSITIVE Sensitive     * >=100,000 COLONIES/mL ESCHERICHIA COLI  SARS Coronavirus 2 by RT PCR (hospital order, performed in [facility] hospital lab) Nasopharyngeal Nasopharyngeal Swab     Status: None   Collection Time: 04/11/20  3:01 PM   Specimen: Nasopharyngeal Swab  Result Value Ref Range Status   SARS Coronavirus 2 NEGATIVE NEGATIVE Final    Comment: (NOTE) SARS-CoV-2 target nucleic acids are NOT DETECTED.  The SARS-CoV-2 RNA is generally detectable in upper and lower respiratory specimens during the acute phase of infection. The lowest concentration of SARS-CoV-2 viral copies this assay can detect is 250 copies / mL. A negative result does not preclude SARS-CoV-2 infection and should not be used as the sole basis for treatment or other patient management decisions.  A negative result may occur with improper specimen collection / handling, submission of specimen other than nasopharyngeal swab, presence of viral mutation(s) within the areas targeted by this assay, and inadequate number of viral copies (<250 copies / mL). A negative  result must be combined with clinical observations, patient history, and epidemiological information.  Fact Sheet for Patients:   BoilerBrush.com.cy  Fact Sheet for Healthcare Providers: https://pope.com/  This test is not yet approved or  cleared by the [place] FDA and has been authorized for detection and/or diagnosis of SARS-CoV-2 by FDA under an Emergency Use Authorization (EUA).  This EUA will remain in effect (meaning this test can be used) for the duration of the COVID-19 declaration under Section 564(b)(1) of the Act, 21 U.S.C. section 360bbb-3(b)(1), unless the authorization is terminated or revoked sooner.  Performed at [facility], [address] [place], [place] [address]          Radiology Studies: CT HEAD WO CONTRAST  Result Date: 04/11/2020 CLINICAL DATA:  Headache EXAM: CT HEAD WITHOUT CONTRAST TECHNIQUE: Contiguous  axial images were obtained from the base of the skull through the vertex without intravenous contrast. COMPARISON:  None. FINDINGS: Brain: There is atrophy and chronic small vessel disease changes. No acute intracranial abnormality. Specifically, no hemorrhage, hydrocephalus, mass lesion, acute infarction, or significant intracranial injury. Vascular: No hyperdense vessel or unexpected calcification. Skull: No acute calvarial abnormality. Sinuses/Orbits: Visualized paranasal sinuses and mastoids clear. Orbital soft tissues unremarkable. Other: None IMPRESSION: Atrophy, chronic microvascular disease. No acute intracranial abnormality. Electronically Signed   By: Charlett Nose M.D.   On: 04/11/2020 17:44        Scheduled Meds: . amLODipine  10 mg Oral Daily  . aspirin EC  81 mg Oral Daily  . atorvastatin  40 mg Oral QHS  . enoxaparin (LOVENOX) injection  40 mg Subcutaneous Q24H  . gabapentin  300 mg Oral QHS  . insulin aspart  0-9 Units Subcutaneous Q4H  . losartan  25 mg Oral Daily    . multivitamin with minerals  1 tablet Oral Daily  . multivitamin-lutein  1 capsule Oral QPM  . QUEtiapine  25 mg Oral QHS   Continuous Infusions: . cefTRIAXone (ROCEPHIN)  IV Stopped (04/12/20 1558)  . dextrose 5 % and 0.45% NaCl 55 mL/hr at 04/13/20 0537    Assessment & Plan:   Active Problems:   UTI (urinary tract infection)   1. UTI- Positive UA on admission Afebrile today, leukocytosis resolved We will continue ceftriaxone  Urine culture with E. coli, sensitivity pending  2. Weakness/decrease po intake-still not eating.   Likely from infection from UTI.  Continue gentle hydration but will switch to D5 half for IV fluids as she has been hypoglycemic Place on carb diet PT evaluation pending DC   3.HA Reported by family and pt. bp elevated currently, unsure if bp causing HA or HA elevating it. BP improving CT of the head without acute abnormalities    4. Essential HTN- elevated currently.  Iv hydralazine prn Continue amlodipine Continue losartan home dose at a lower dose at 25 mg daily  5. DM type 2 on insulin DC'd standing dose Lantus as she has not been hypoglycemic this a.m. given OJ with improvement Now that she is eating we will discontinue IV fluid D5 riss Ck fs Hypoglycemic protocal  DVT prophylaxis: lovenox Code Status: full Family Communication:  None at bedside Disposition Plan: back to previous home life  versus rehab PT evaluation pending Barrier: Still weak and unable to take p.o. intake and receiving IV antibiotics also needs PT evaluation Anticipated discharge to be determined when medically stable as above, possibly in 1 to 2 days.  Awaiting PT evaluation      LOS: 1 day   Time spent: 45 min with >50% on coc    Lynn Ito, MD [facility] Pager [phone]  If 7PM-7AM, please contact night-coverage [email or web address] Password [facility] 04/13/2020, 4:01 PM

## 2020-04-13 NOTE — Evaluation (Signed)
Physical Therapy Evaluation Patient Details Name: Madison Barnett MRN: [id number] DOB: [birth year] Today's Date: 04/13/2020   History of Present Illness  Pt is an 86 y.o. female with a history of hypertension, DL,  diabetes and stroke who comes the ED complaining of  generalized weakness, nausea, decreased oral intake for the past 2 days associated with dysuria and frequency.  MD assessment includes: UTI, weakness, decreased PO intake, headache, HTN, and DM.    Clinical Impression  Pt pleasant and motivated to participate during the session.  Utilized Academic librarian interpreter Cira Rue [phone] with interpreter primarily utilized for backup with pt doing well most of the time with english.  Pt was functionally very weak requiring heavy Mod A for BLE and trunk control with sup to/from sit.  Upon coming to sitting pt reported severe dizziness and required to return to supine. BP taken in supine at 148/62 with symptoms resolving quickly, nsg notified. Pt reported feeling progressively weaker recently prior to admission as well as multiple falls due to LOB.  Pt would not be safe to return to her prior living situation at this time but will watch closely for possible change in recommendaton as pt can tolerate increased activity.  Pt will benefit from PT services in a SNF setting upon discharge to safely address deficits listed in patient problem list for decreased caregiver assistance and eventual return to PLOF.      Follow Up Recommendations SNF    Equipment Recommendations  Other (comment) (TBD at next venue of care)    Recommendations for Other Services       Precautions / Restrictions Precautions Precautions: Fall Restrictions Weight Bearing Restrictions: No      Mobility  Bed Mobility Overal bed mobility: Needs Assistance Bed Mobility: Supine to Sit;Sit to Supine     Supine to sit: Mod assist Sit to supine: Mod assist   General bed mobility comments: Heavy mod A for BLE and  trunk control  Transfers                 General transfer comment: Unable/unsafe to attempt secondary to dizziness in sitting  Ambulation/Gait                Stairs            Wheelchair Mobility    Modified Rankin (Stroke Patients Only)       Balance Overall balance assessment: Needs assistance   Sitting balance-Leahy Scale: Poor Sitting balance - Comments: Dizziness with associated instability in sitting                                     Pertinent Vitals/Pain Pain Assessment: No/denies pain    Home Living Family/patient expects to be discharged to:: Private residence Living Arrangements: Children Available Help at Discharge: Family;Available 24 hours/day;Personal care attendant Type of Home: House Home Access: Stairs to enter   [employer] of Steps: Pt could not remember if there were stairs to enter home Home Layout: One level Home Equipment: Walker - 2 wheels Additional Comments: PCA 2x/wk for bathing    Prior Function Level of Independence: Needs assistance   Gait / Transfers Assistance Needed: Mod Ind amb with a RW, "several" falls in the last year secondary to LOB  ADL's / Homemaking Assistance Needed: PCA assists with bathing        Hand Dominance        Extremity/Trunk  Assessment   Upper Extremity Assessment Upper Extremity Assessment: Generalized weakness    Lower Extremity Assessment Lower Extremity Assessment: Generalized weakness       Communication   Communication: Interpreter utilized;Other (comment) (Tagalog interpreter utilized, [place], [place])  Cognition Arousal/Alertness: Awake/alert Behavior During Therapy: WFL for tasks assessed/performed Overall Cognitive Status: No family/caregiver present to determine baseline cognitive functioning                                 General Comments: Pt minimally confused at times with extra time and cuing needed to follow  commands      General Comments      Exercises Total Joint Exercises Ankle Circles/Pumps: AROM;Strengthening;Both;5 reps;10 reps Quad Sets: Strengthening;Both;5 reps;10 reps Gluteal Sets: Strengthening;Both;10 reps;5 reps Short Arc Quad: Strengthening;Both;10 reps Heel Slides: Strengthening;Both;5 reps Hip ABduction/ADduction: AAROM;Strengthening;Both;10 reps Straight Leg Raises: AAROM;Strengthening;Both;10 reps   Assessment/Plan    PT Assessment Patient needs continued PT services  PT Problem List Decreased strength;Decreased activity tolerance;Decreased balance;Decreased mobility;Decreased coordination;Decreased knowledge of use of DME       PT Treatment Interventions      PT Goals (Current goals can be found in the Care Plan section)  Acute Rehab PT Goals Patient Stated Goal: To get stronger PT Goal Formulation: With patient Time For Goal Achievement: 04/26/20 Potential to Achieve Goals: Fair    Frequency Min 2X/week   Barriers to discharge Inaccessible home environment;Decreased caregiver support      Co-evaluation               AM-PAC PT "6 Clicks" Mobility  Outcome Measure Help needed turning from your back to your side while in a flat bed without using bedrails?: A Lot Help needed moving from lying on your back to sitting on the side of a flat bed without using bedrails?: A Lot Help needed moving to and from a bed to a chair (including a wheelchair)?: Total Help needed standing up from a chair using your arms (e.g., wheelchair or bedside chair)?: Total Help needed to walk in hospital room?: Total Help needed climbing 3-5 steps with a railing? : Total 6 Click Score: 8    End of Session Equipment Utilized During Treatment: Gait belt Activity Tolerance: Treatment limited secondary to medical complications (Comment) (Limited by dizziness in sitting) Patient left: in bed;with call bell/phone within reach;with bed alarm set;with nursing/sitter in room Nurse  Communication: Mobility status;Other (comment) (Pt with severe dizziness in sitting with BP in supine 148/62) PT Visit Diagnosis: History of falling (Z91.81);Muscle weakness (generalized) (M62.81);Difficulty in walking, not elsewhere classified (R26.2)    Time: 1430-1510 PT Time Calculation (min) (ACUTE ONLY): 40 min   Charges:   PT Evaluation $PT Eval Moderate Complexity: 1 Mod PT Treatments $Therapeutic Exercise: 8-22 mins        D. Royetta Asal PT, DPT 04/13/20, 4:31 PM

## 2020-04-14 LAB — GLUCOSE, CAPILLARY
Glucose-Capillary: 152 mg/dL — ABNORMAL HIGH (ref 70–99)
Glucose-Capillary: 148 mg/dL — ABNORMAL HIGH (ref 70–99)
Glucose-Capillary: 205 mg/dL — ABNORMAL HIGH (ref 70–99)
Glucose-Capillary: 224 mg/dL — ABNORMAL HIGH (ref 70–99)
Glucose-Capillary: 246 mg/dL — ABNORMAL HIGH (ref 70–99)

## 2020-04-14 MED ORDER — BACID PO TABS
2.0000 | ORAL_TABLET | Freq: Three times a day (TID) | ORAL | Status: DC
  Filled 2020-04-14 (×2): qty 2

## 2020-04-14 MED ORDER — INSULIN ASPART 100 UNIT/ML ~~LOC~~ SOLN
0.0000 [IU] | Freq: Every day | SUBCUTANEOUS | Status: DC
  Administered 2020-04-14: 2 [IU] via SUBCUTANEOUS
  Filled 2020-04-14: qty 1

## 2020-04-14 MED ORDER — INSULIN ASPART 100 UNIT/ML ~~LOC~~ SOLN
0.0000 [IU] | Freq: Three times a day (TID) | SUBCUTANEOUS | Status: DC
  Administered 2020-04-14 – 2020-04-15 (×2): 3 [IU] via SUBCUTANEOUS
  Administered 2020-04-15: 2 [IU] via SUBCUTANEOUS
  Filled 2020-04-14 (×3): qty 1

## 2020-04-14 MED ORDER — RISAQUAD PO CAPS
1.0000 | ORAL_CAPSULE | Freq: Three times a day (TID) | ORAL | Status: DC
  Administered 2020-04-14 – 2020-04-15 (×2): 1 via ORAL
  Filled 2020-04-14 (×3): qty 1

## 2020-04-14 MED ORDER — BACID PO TABS: 2.0000 | ORAL_TABLET | Freq: Three times a day (TID) | ORAL | 0 refills | Status: AC

## 2020-04-14 MED ORDER — AMLODIPINE BESYLATE 10 MG PO TABS: 10.0000 mg | ORAL_TABLET | Freq: Every day | ORAL | 0 refills | Status: AC

## 2020-04-14 MED ORDER — LOSARTAN POTASSIUM 50 MG PO TABS: 50.0000 mg | ORAL_TABLET | Freq: Every day | ORAL | 0 refills | Status: AC

## 2020-04-14 MED ORDER — CEPHALEXIN 500 MG PO CAPS
500.0000 mg | ORAL_CAPSULE | Freq: Three times a day (TID) | ORAL | Status: DC
  Administered 2020-04-14 – 2020-04-15 (×2): 500 mg via ORAL
  Filled 2020-04-14 (×3): qty 1

## 2020-04-14 MED ORDER — CEPHALEXIN 500 MG PO CAPS: 500.0000 mg | ORAL_CAPSULE | Freq: Three times a day (TID) | ORAL | 0 refills | Status: AC

## 2020-04-14 NOTE — Progress Notes (Signed)
Inpatient Diabetes Program Recommendations  AACE/ADA: New Consensus Statement on Inpatient Glycemic Control   Target Ranges:  Prepandial:   less than 140 mg/dL      Peak postprandial:   less than 180 mg/dL (1-2 hours)      Critically ill patients:  140 - 180 mg/dL   Results for DE ALEC, MCPHEE (MRN [id number]) as of 04/14/2020 12:24  Ref. Range 04/13/2020 07:41 04/13/2020 11:25 04/13/2020 15:21 04/13/2020 21:34 04/13/2020 23:34 04/14/2020 04:28 04/14/2020 07:59 04/14/2020 11:16  Glucose-Capillary Latest Ref Range: 70 - 99 mg/dL 979 (H) 892 (H) 119 (H) 259 (H) 228 (H) 152 (H) 148 (H) 205 (H)   Review of Glycemic Control  Diabetes history: DM2 Outpatient Diabetes medications: Lantus 25 units daily, Humalog 0-4 units (not taking), Metformin 500 mg daily Current orders for Inpatient glycemic control: Novolog 0-9 units Q4H   Inpatient Diabetes Program Recommendations:    Insulin-Meal Coverage: Please consider ordering Novolog 2 units TID with meals for meal coverage if patient eats at least 50% of meals.  Thanks, Orlando Penner, RN, MSN, CDE Diabetes Coordinator Inpatient Diabetes Program [phone] (Team Pager from 8am to 5pm)

## 2020-04-14 NOTE — Discharge Summary (Signed)
Madison Barnett ZOX:[id number] DOB: 1933/04/10 DOA: 04/11/2020  PCP: Madison Barnett, Vishwanath, MD  Admit date: 04/11/2020 Discharge date: 04/14/2020  Admitted From: Home Disposition: [facility]  Recommendations for Outpatient Follow-up:  1. Follow up with PCP in 1 week 2. Please obtain BMP/CBC in one week      Discharge Condition:Stable CODE STATUS: Full Diet recommendation: Carb modified Brief/Interim Summary: Madison Barnett is a 86 y.o. female with medical history significant of Madison FudgeLuz de Jesusis a 86 y.o.femalewith a history of hypertension, DL,  diabetes and stroke who comes the ED complaining ofgeneralized weakness, nausea, decreased oral intake for the past 2 days associated with dysuria and frequency per ER. Pt was  sleepy and somewhat poor historian as she is sleepy. She reported feeling tired, currently c/o HA, no Nausea, cp, sob, fever, or chills.  I spoke to family , pt has been c/o HA and appeared disoriented. They felt due to the HA causing disorientation. They reported nausea too.  She was admitted to the hospitalist service.  Due to her headache she had a CT scan that was without any acute abnormality full results below.  Her blood pressure was elevated.  Her medications were adjusted.  She had a urinary tract infection which she was started on IV antibiotics.  Urine culture grew E. coli with sensitivities.  1. UTI- Urine culture with E. coli Continued on ceftriaxone IV will switch to Keflex p.o. for outpatient to complete course  2. Weakness/decrease po intake Likely from infection from UTI.  Started on IV hydration with improvement Now she is eating  3.HA Reported by family and pt. bp elevated currently, unsure if bp causing HA or HA elevating it. Blood pressure better controlled CT without acute abnormality Neurologically she appears intact. Now resolved   4. Essential HTN-  Better controlled Continue amlodipine 10 mg daily and losartan 50 mg daily  5. DM type 2  on insulin Lantus was decreased due to decreased p.o. intake while in the hospital now that her p.o. intake has improved she can likely go back to her home dose  Discharge Diagnoses:  Active Problems:   UTI (urinary tract infection)    Discharge Instructions  Discharge Instructions    Call MD for:  temperature >100.4   Complete by: As directed    Diet - low sodium heart healthy   Complete by: As directed    Discharge instructions   Complete by: As directed    F/u with pcp in one week   Increase activity slowly   Complete by: As directed      Allergies as of 04/14/2020   No Known Allergies     Medication List    TAKE these medications   amLODipine 10 MG tablet Commonly known as: NORVASC Take 1 tablet (10 mg total) by mouth daily. Start taking on: April 15, 2020 What changed:   medication strength  how much to take   aspirin EC 81 MG tablet Take 81 mg by mouth daily.   atorvastatin 40 MG tablet Commonly known as: Lipitor Take 1 tablet (40 mg total) by mouth at bedtime.   cephALEXin 500 MG capsule Commonly known as: KEFLEX Take 1 capsule (500 mg total) by mouth every 8 (eight) hours for 2 days.   diclofenac sodium 1 % Gel Commonly known as: VOLTAREN Apply 2 g topically as needed (pain).   gabapentin 300 MG capsule Commonly known as: Neurontin Take 1 capsule (300 mg total) by mouth at bedtime.   insulin glargine 100  UNIT/ML injection Commonly known as: LANTUS Inject 25 Units into the skin daily.   insulin lispro 100 UNIT/ML injection Commonly known as: HUMALOG Inject 0-4 Units into the skin. Per sliding scale   lactobacillus acidophilus Tabs tablet Take 2 tablets by mouth 3 (three) times daily.   losartan 50 MG tablet Commonly known as: Cozaar Take 1 tablet (50 mg total) by mouth daily. What changed: how much to take   metFORMIN 500 MG tablet Commonly known as: GLUCOPHAGE Take 500 mg by mouth daily with breakfast.   multivitamin-lutein Caps  capsule Take 1 capsule by mouth daily.   pregabalin 50 MG capsule Commonly known as: LYRICA Take 50 mg by mouth at bedtime.       Contact information for after-discharge care    Destination    [facility] Preferred SNF .   Service: Skilled Nursing Contact information: [address] [place] [address] [phone]                 No Known Allergies  Consultations:  None   Procedures/Studies: CT HEAD WO CONTRAST  Result Date: 04/11/2020 CLINICAL DATA:  Headache EXAM: CT HEAD WITHOUT CONTRAST TECHNIQUE: Contiguous axial images were obtained from the base of the skull through the vertex without intravenous contrast. COMPARISON:  None. FINDINGS: Brain: There is atrophy and chronic small vessel disease changes. No acute intracranial abnormality. Specifically, no hemorrhage, hydrocephalus, mass lesion, acute infarction, or significant intracranial injury. Vascular: No hyperdense vessel or unexpected calcification. Skull: No acute calvarial abnormality. Sinuses/Orbits: Visualized paranasal sinuses and mastoids clear. Orbital soft tissues unremarkable. Other: None IMPRESSION: Atrophy, chronic microvascular disease. No acute intracranial abnormality. Electronically Signed   By: Charlett Nose M.D.   On: 04/11/2020 17:44   DG Chest Portable 1 View  Result Date: 04/11/2020 CLINICAL DATA:  Weakness, nausea EXAM: PORTABLE CHEST 1 VIEW COMPARISON:  None. FINDINGS: Cardiomegaly. Aortic atherosclerosis. Bibasilar atelectasis or scarring. Mild vascular congestion. No overt edema or effusions. No acute bony abnormality. IMPRESSION: Cardiomegaly, vascular congestion. Bibasilar atelectasis or scarring. Electronically Signed   By: Charlett Nose M.D.   On: 04/11/2020 15:21       Subjective: Patient reports feeling better today she is awake alert answering all questions.  She ate her breakfast.  Denies any headache, abdominal pain, chest pain or shortness of  breath  Discharge Exam: Vitals:   04/14/20 0847 04/14/20 1206  BP: 136/78 (!) 146/63  Pulse:  77  Resp:  18  Temp:  99.1 F (37.3 C)  SpO2:  97%   Vitals:   04/13/20 1123 04/13/20 2009 04/14/20 0847 04/14/20 1206  BP: (!) 157/65 (!) 145/80 136/78 (!) 146/63  Pulse: 80 94  77  Resp: Temp: 98.2 F (36.8 C) 98.4 F (36.9 C)  99.1 F (37.3 C)  TempSrc: Oral Oral  Oral  SpO2: 98% 97%  97%  Weight:      Height:        General: Pt is alert, awake, not in acute distress Cardiovascular: RRR, S1/S2 +, no rubs, no gallops Respiratory: CTA bilaterally, no wheezing, no rhonchi Abdominal: Soft, NT, ND, bowel sounds + Extremities: no edema, no cyanosis Neuro 5 out of 5 motor strength x4.  Alert oriented x3    The results of significant diagnostics from this hospitalization (including imaging, microbiology, ancillary and laboratory) are listed below for reference.     Microbiology: Recent Results (from the past 240 hour(s))  Urine culture  Status: Abnormal   Collection Time: 04/11/20  3:01 PM   Specimen: Urine, Catheterized  Result Value Ref Range Status   Specimen Description   Final    URINE, CATHETERIZED Performed at [facility], [address] [place], [place] [address]    Special Requests   Final    NONE Performed at [facility], [address] [place], [place] [address]    Culture >=100,000 COLONIES/mL ESCHERICHIA COLI (A)  Final   Report Status 04/13/2020 FINAL  Final   Organism ID, Bacteria ESCHERICHIA COLI (A)  Final      Susceptibility   Escherichia coli - MIC*    AMPICILLIN >=32 RESISTANT Resistant     CEFAZOLIN <=4 SENSITIVE Sensitive     CEFTRIAXONE <=0.25 SENSITIVE Sensitive     CIPROFLOXACIN <=0.25 SENSITIVE Sensitive     GENTAMICIN <=1 SENSITIVE Sensitive     IMIPENEM <=0.25 SENSITIVE Sensitive     NITROFURANTOIN <=16 SENSITIVE Sensitive     TRIMETH/SULFA <=20 SENSITIVE Sensitive     AMPICILLIN/SULBACTAM 16  INTERMEDIATE Intermediate     PIP/TAZO <=4 SENSITIVE Sensitive     * >=100,000 COLONIES/mL ESCHERICHIA COLI  SARS Coronavirus 2 by RT PCR (hospital order, performed in [facility] hospital lab) Nasopharyngeal Nasopharyngeal Swab     Status: None   Collection Time: 04/11/20  3:01 PM   Specimen: Nasopharyngeal Swab  Result Value Ref Range Status   SARS Coronavirus 2 NEGATIVE NEGATIVE Final    Comment: (NOTE) SARS-CoV-2 target nucleic acids are NOT DETECTED.  The SARS-CoV-2 RNA is generally detectable in upper and lower respiratory specimens during the acute phase of infection. The lowest concentration of SARS-CoV-2 viral copies this assay can detect is 250 copies / mL. A negative result does not preclude SARS-CoV-2 infection and should not be used as the sole basis for treatment or other patient management decisions.  A negative result may occur with improper specimen collection / handling, submission of specimen other than nasopharyngeal swab, presence of viral mutation(s) within the areas targeted by this assay, and inadequate number of viral copies (<250 copies / mL). A negative result must be combined with clinical observations, patient history, and epidemiological information.  Fact Sheet for Patients:   BoilerBrush.com.cy  Fact Sheet for Healthcare Providers: https://pope.com/  This test is not yet approved or  cleared by the [place] FDA and has been authorized for detection and/or diagnosis of SARS-CoV-2 by FDA under an Emergency Use Authorization (EUA).  This EUA will remain in effect (meaning this test can be used) for the duration of the COVID-19 declaration under Section 564(b)(1) of the Act, 21 U.S.C. section 360bbb-3(b)(1), unless the authorization is terminated or revoked sooner.  Performed at [facility], [address] [place], [place] [address]      Labs: BNP (last 3 results) No results for  input(s): BNP in the last 8760 hours. Basic Metabolic Panel: Recent Labs  Lab 04/11/20 1432 04/12/20 0914  NA 131* 137  K 4.0 3.8  CL 99 104  CO2 21* 23  GLUCOSE 157* 130*  BUN 26* 20  CREATININE 0.86 0.95  CALCIUM 9.0 8.6*   Liver Function Tests: Recent Labs  Lab 04/11/20 1432  AST 31  ALT 14  ALKPHOS 79  BILITOT 1.3*  PROT 8.2*  ALBUMIN 4.3   Recent Labs  Lab 04/11/20 1432  LIPASE 36   No results for input(s): AMMONIA in the last 168 hours. CBC: Recent Labs  Lab 04/11/20 1432 04/12/20 0914  WBC 10.8* 8.5  NEUTROABS 7.5  --   HGB 13.7 12.4  HCT 39.6 36.0  MCV 90.6 89.8  PLT 241 206   Cardiac Enzymes: No results for input(s): CKTOTAL, CKMB, CKMBINDEX, TROPONINI in the last 168 hours. BNP: Invalid input(s): POCBNP CBG: Recent Labs  Lab 04/13/20 2134 04/13/20 2334 04/14/20 0428 04/14/20 0759 04/14/20 1116  GLUCAP 259* 228* 152* 148* 205*   D-Dimer No results for input(s): DDIMER in the last 72 hours. Hgb A1c No results for input(s): HGBA1C in the last 72 hours. Lipid Profile No results for input(s): CHOL, HDL, LDLCALC, TRIG, CHOLHDL, LDLDIRECT in the last 72 hours. Thyroid function studies No results for input(s): TSH, T4TOTAL, T3FREE, THYROIDAB in the last 72 hours.  Invalid input(s): FREET3 Anemia work up No results for input(s): VITAMINB12, FOLATE, FERRITIN, TIBC, IRON, RETICCTPCT in the last 72 hours. Urinalysis    Component Value Date/Time   COLORURINE YELLOW (A) 04/11/2020 1501   APPEARANCEUR CLOUDY (A) 04/11/2020 1501   LABSPEC 1.011 04/11/2020 1501   PHURINE 6.0 04/11/2020 1501   GLUCOSEU 50 (A) 04/11/2020 1501   HGBUR NEGATIVE 04/11/2020 1501   BILIRUBINUR NEGATIVE 04/11/2020 1501   KETONESUR NEGATIVE 04/11/2020 1501   PROTEINUR 100 (A) 04/11/2020 1501   NITRITE POSITIVE (A) 04/11/2020 1501   LEUKOCYTESUR LARGE (A) 04/11/2020 1501   Sepsis Labs Invalid input(s): PROCALCITONIN,  WBC,  LACTICIDVEN Microbiology Recent Results  (from the past 240 hour(s))  Urine culture     Status: Abnormal   Collection Time: 04/11/20  3:01 PM   Specimen: Urine, Catheterized  Result Value Ref Range Status   Specimen Description   Final    URINE, CATHETERIZED Performed at [facility], [address] [place], [place] [address]    Special Requests   Final    NONE Performed at [facility], [address] [place], [place] [address]    Culture >=100,000 COLONIES/mL ESCHERICHIA COLI (A)  Final   Report Status 04/13/2020 FINAL  Final   Organism ID, Bacteria ESCHERICHIA COLI (A)  Final      Susceptibility   Escherichia coli - MIC*    AMPICILLIN >=32 RESISTANT Resistant     CEFAZOLIN <=4 SENSITIVE Sensitive     CEFTRIAXONE <=0.25 SENSITIVE Sensitive     CIPROFLOXACIN <=0.25 SENSITIVE Sensitive     GENTAMICIN <=1 SENSITIVE Sensitive     IMIPENEM <=0.25 SENSITIVE Sensitive     NITROFURANTOIN <=16 SENSITIVE Sensitive     TRIMETH/SULFA <=20 SENSITIVE Sensitive     AMPICILLIN/SULBACTAM 16 INTERMEDIATE Intermediate     PIP/TAZO <=4 SENSITIVE Sensitive     * >=100,000 COLONIES/mL ESCHERICHIA COLI  SARS Coronavirus 2 by RT PCR (hospital order, performed in [facility] hospital lab) Nasopharyngeal Nasopharyngeal Swab     Status: None   Collection Time: 04/11/20  3:01 PM   Specimen: Nasopharyngeal Swab  Result Value Ref Range Status   SARS Coronavirus 2 NEGATIVE NEGATIVE Final    Comment: (NOTE) SARS-CoV-2 target nucleic acids are NOT DETECTED.  The SARS-CoV-2 RNA is generally detectable in upper and lower respiratory specimens during the acute phase of infection. The lowest concentration of SARS-CoV-2 viral copies this assay can detect is 250 copies / mL. A negative result does not preclude SARS-CoV-2 infection and should not be used as the sole basis for treatment or other patient management decisions.  A negative result may occur with improper specimen collection / handling, submission of specimen  other than nasopharyngeal swab, presence of viral mutation(s) within the  areas targeted by this assay, and inadequate number of viral copies (<250 copies / mL). A negative result must be combined with clinical observations, patient history, and epidemiological information.  Fact Sheet for Patients:   BoilerBrush.com.cy  Fact Sheet for Healthcare Providers: https://pope.com/  This test is not yet approved or  cleared by the [place] FDA and has been authorized for detection and/or diagnosis of SARS-CoV-2 by FDA under an Emergency Use Authorization (EUA).  This EUA will remain in effect (meaning this test can be used) for the duration of the COVID-19 declaration under Section 564(b)(1) of the Act, 21 U.S.C. section 360bbb-3(b)(1), unless the authorization is terminated or revoked sooner.  Performed at [facility], [address], [place], [place] [address]      Time coordinating discharge: Over 30 minutes  SIGNED:   Lynn Ito, MD  [facility] 04/14/2020, 4:20 PM Pager   If 7PM-7AM, please contact night-coverage [email or web address] Password [email or web address]

## 2020-04-14 NOTE — TOC Initial Note (Addendum)
Transition of Care [facility]) - Initial/Assessment Note    Patient Details  Name: Madison Barnett MRN: [id number] Date of Birth: 1933-05-22  Transition of [facility]) CM/SW Contact:    Chapman Fitch, RN Phone Number: 04/14/2020, 2:00 PM  Clinical Narrative:                 Patient admitted from home with UTI/AMS Patient lives at home with son/family   Patient followed by [facility] spoke with [facility] RN Olegario Messier [phone].  Per Olegario Messier at baseline patient is mobile with RW, and independent of ADLS.  Notified Olegario Messier of current recommendation for SNF.    Dr. Providence Lanius at [facility] to contact Dr Marylu Lund directly to discuss discharge disposition    UPDATE:  Son a bedside, and states that they would like for patient to go to SNF, and they have notified [facility].  While in the room I spoke with NP, Malachi Paradise [phone].  She confirms plan is for SNF.  Per [place] [facility] in [facility] with [facility].  Son agreeable to bed search.    They have accepted bed at [facility]. Plan to discharge in the morning.  I have left a voicemail NP, Malachi Paradise [phone].  [facility] will need to arrange transport in the morning    Expected Discharge Plan: Home w Home Health Services     Patient Goals and CMS Choice        Expected Discharge Plan and Services Expected Discharge Plan: Home w Home Health Services   Discharge Planning Services: CM Consult Post Acute Care Choice: Durable Medical Equipment Living arrangements for the past 2 months: Single Family Home                                      Prior Living Arrangements/Services Living arrangements for the past 2 months: Single Family Home Lives with:: Adult Children Patient language and need for interpreter reviewed:: Yes        Need for Family Participation in Patient Care: Yes (Comment) Care giver support system in place?: Yes (comment)   Criminal Activity/Legal Involvement Pertinent to Current Situation/Hospitalization: No -  Comment as needed  Activities of Daily Living Home Assistive Devices/Equipment: None ADL Screening (condition at time of admission) Patient's cognitive ability adequate to safely complete daily activities?: Yes Is the patient deaf or have difficulty hearing?: No Does the patient have difficulty seeing, even when wearing glasses/contacts?: No Does the patient have difficulty concentrating, remembering, or making decisions?: No Patient able to express need for assistance with ADLs?: Yes Does the patient have difficulty dressing or bathing?: No Independently performs ADLs?: Yes (appropriate for developmental age) Does the patient have difficulty walking or climbing stairs?: No Weakness of Legs: None Weakness of Arms/Hands: None  Permission Sought/Granted                  Emotional Assessment           Psych Involvement: No (comment)  Admission diagnosis:  Dehydration [E86.0] UTI (urinary tract infection) [N39.0] Generalized weakness [R53.1] Cystitis [N30.90] Type 2 diabetes mellitus without complication, with long-term current use of insulin (HCC) [E11.9, Z79.4] Patient Active Problem List   Diagnosis Date Noted  . UTI (urinary tract infection) 09/23/2018   PCP:  Barbette Reichmann, MD Pharmacy:   [facility] [address] [place] - [address] [place] [address] Phone:  [phone] Fax: [phone]     Social Determinants of Health (SDOH) Interventions    Readmission Risk Interventions No flowsheet data found.

## 2020-04-14 NOTE — NC FL2 (Signed)
[place] MEDICAID FL2 LEVEL OF CARE SCREENING TOOL     IDENTIFICATION  Patient Name: Madison Barnett Birthdate: Jan 02, 1933 Sex: female Admission Date (Current Location): 04/11/2020  [facility] and [place] Number:  Chiropodist and Address:         Provider Number: [id number]  Attending Physician Name and Address:  Lynn Ito, MD  Relative Name and Phone Number:       Current Level of Care: Hospital Recommended Level of Care: Skilled Nursing Facility Prior Approval Number:    Date Approved/Denied:   PASRR Number: [id number]  Discharge Plan: Home    Current Diagnoses: Patient Active Problem List   Diagnosis Date Noted   UTI (urinary tract infection) 09/23/2018    Orientation RESPIRATION BLADDER Height & Weight     Self, Place  Normal Incontinent, External catheter Weight: 65 kg Height:  5\' 4"  (162.6 cm)  BEHAVIORAL SYMPTOMS/MOOD NEUROLOGICAL BOWEL NUTRITION STATUS      Continent Diet (Carb modified)  AMBULATORY STATUS COMMUNICATION OF NEEDS Skin   Extensive Assist Verbally Normal                       Personal Care Assistance Level of Assistance              Functional Limitations Info             SPECIAL CARE FACTORS FREQUENCY  PT (By licensed PT), OT (By licensed OT) (Primary Language [place].  Does speak some English)                    Contractures Contractures Info: Not present    Additional Factors Info  Code Status, Allergies Code Status Info: Full Allergies Info: NKDA           Current Medications (04/14/2020):  This is the current hospital active medication list Current Facility-Administered Medications  Medication Dose Route Frequency Provider Last Rate Last Admin   acetaminophen (TYLENOL) tablet 650 mg  650 mg Oral Q6H PRN 04/16/2020, MD   650 mg at 04/12/20 1305   Or   acetaminophen (TYLENOL) suppository 650 mg  650 mg Rectal Q6H PRN 04/14/20, MD       amLODipine (NORVASC) tablet 10 mg  10 mg Oral  Daily Lynn Ito, MD   10 mg at 04/14/20 0847   aspirin EC tablet 81 mg  81 mg Oral Daily 04/16/20, MD   81 mg at 04/14/20 0846   atorvastatin (LIPITOR) tablet 40 mg  40 mg Oral QHS 04/16/20, MD   40 mg at 04/13/20 2137   cefTRIAXone (ROCEPHIN) 1 g in sodium chloride 0.9 % 100 mL IVPB  1 g Intravenous Q24H Hall, Scott A, RPH 200 mL/hr at 04/14/20 1459 1 g at 04/14/20 1459   enoxaparin (LOVENOX) injection 40 mg  40 mg Subcutaneous Q24H 04/16/20, MD   40 mg at 04/13/20 2137   gabapentin (NEURONTIN) capsule 300 mg  300 mg Oral QHS 2138, MD   300 mg at 04/13/20 2137   hydrALAZINE (APRESOLINE) injection 10 mg  10 mg Intravenous Q6H PRN 2138, MD       insulin aspart (novoLOG) injection 0-5 Units  0-5 Units Subcutaneous QHS Lynn Ito, RPH       insulin aspart (novoLOG) injection 0-9 Units  0-9 Units Subcutaneous TID WC Lowella Bandy, RPH       losartan (COZAAR) tablet 25 mg  25 mg Oral Daily  Lynn Ito, MD   25 mg at 04/14/20 0847   multivitamin with minerals tablet 1 tablet  1 tablet Oral Daily Lynn Ito, MD   1 tablet at 04/14/20 0846   multivitamin-lutein (OCUVITE-LUTEIN) capsule 1 capsule  1 capsule Oral QPM Lynn Ito, MD   1 capsule at 04/13/20 1612   QUEtiapine (SEROQUEL) tablet 25 mg  25 mg Oral QHS Jimmye Norman, NP   25 mg at 04/13/20 2137   senna-docusate (Senokot-S) tablet 1 tablet  1 tablet Oral QHS PRN Lynn Ito, MD         Discharge Medications: Please see discharge summary for a list of discharge medications.  Relevant Imaging Results:  Relevant Lab Results:   Additional Information ss: [id number]  Chapman Fitch, RN

## 2020-04-15 DIAGNOSIS — R627 Adult failure to thrive: Secondary | ICD-10-CM

## 2020-04-15 DIAGNOSIS — E1169 Type 2 diabetes mellitus with other specified complication: Secondary | ICD-10-CM

## 2020-04-15 DIAGNOSIS — R519 Headache, unspecified: Secondary | ICD-10-CM

## 2020-04-15 LAB — GLUCOSE, CAPILLARY
Glucose-Capillary: 152 mg/dL — ABNORMAL HIGH (ref 70–99)
Glucose-Capillary: 208 mg/dL — ABNORMAL HIGH (ref 70–99)

## 2020-04-15 MED ORDER — SENNOSIDES-DOCUSATE SODIUM 8.6-50 MG PO TABS: 1.0000 | ORAL_TABLET | Freq: Once | ORAL | Status: DC

## 2020-04-15 MED ORDER — RISAQUAD PO CAPS: 1.0000 | ORAL_CAPSULE | Freq: Three times a day (TID) | ORAL | 0 refills | Status: AC

## 2020-04-15 NOTE — Discharge Summary (Signed)
Madison Barnett WUX:[id number] DOB: April 09, 1933 DOA: 04/11/2020  PCP: Barbette Reichmann, MD  Admit date: 04/11/2020 Discharge date: 04/15/2020  Admitted From: Home Disposition: [facility]  Recommendations for Outpatient Follow-up:  1. Follow up with PCP in 1 week 2. Please obtain BMP/CBC in one week      Discharge Condition:Stable CODE STATUS: Full Diet recommendation: Carb modified Brief/Interim Summary: Madison Barnett is a 86 y.o. female with medical history significant of Madison de Jesusis a 86 y.o.femalewith a history of hypertension, DL,  diabetes and stroke who comes the ED complaining ofgeneralized weakness, nausea, decreased oral intake for the past 2 days associated with dysuria and frequency per ER. Pt was  sleepy and somewhat poor historian as she is sleepy. She reported feeling tired, currently c/o HA, no Nausea, cp, sob, fever, or chills.  I spoke to family , pt has been c/o HA and appeared disoriented. They felt due to the HA causing disorientation. They reported nausea too.  She was admitted to the hospitalist service.  Due to her headache she had a CT scan that was without any acute abnormality full results below.  Her blood pressure was elevated.  Her medications were adjusted.  She had a urinary tract infection which she was started on IV antibiotics.  Urine culture grew E. coli with sensitivities.  1. UTI- Urine culture with E. coli Continued on ceftriaxone IV will switch to Keflex p.o. for outpatient to complete course  2. Weakness/decrease po intake Likely from infection from UTI.  Started on IV hydration with improvement Now she is eating  3.HA Reported by family and pt. bp elevated currently, unsure if bp causing HA or HA elevating it. Blood pressure better controlled CT without acute abnormality Neurologically she appears intact. Now resolved   4. Essential HTN-  Better controlled Continue amlodipine 10 mg daily and losartan 50 mg daily  5. DM type 2  on insulin Lantus was decreased due to decreased p.o. intake while in the hospital now that her p.o. intake has improved she can likely go back to her home dose  Discharge Diagnoses:  Active Problems:   UTI (urinary tract infection)    Discharge Instructions  Discharge Instructions    Call MD for:  temperature >100.4   Complete by: As directed    Diet - low sodium heart healthy   Complete by: As directed    Discharge instructions   Complete by: As directed    F/u with pcp in one week   Increase activity slowly   Complete by: As directed      Allergies as of 04/15/2020   No Known Allergies     Medication List    TAKE these medications   amLODipine 10 MG tablet Commonly known as: NORVASC Take 1 tablet (10 mg total) by mouth daily. What changed:   medication strength  how much to take   aspirin EC 81 MG tablet Take 81 mg by mouth daily.   atorvastatin 40 MG tablet Commonly known as: Lipitor Take 1 tablet (40 mg total) by mouth at bedtime.   cephALEXin 500 MG capsule Commonly known as: KEFLEX Take 1 capsule (500 mg total) by mouth every 8 (eight) hours for 2 days.   diclofenac sodium 1 % Gel Commonly known as: VOLTAREN Apply 2 g topically as needed (pain).   gabapentin 300 MG capsule Commonly known as: Neurontin Take 1 capsule (300 mg total) by mouth at bedtime.   insulin glargine 100 UNIT/ML injection Commonly known as: LANTUS  Inject 25 Units into the skin daily.   insulin lispro 100 UNIT/ML injection Commonly known as: HUMALOG Inject 0-4 Units into the skin. Per sliding scale   lactobacillus acidophilus Tabs tablet Take 2 tablets by mouth 3 (three) times daily.   acidophilus Caps capsule Take 1 capsule by mouth 3 (three) times daily.   losartan 50 MG tablet Commonly known as: Cozaar Take 1 tablet (50 mg total) by mouth daily. What changed: how much to take   metFORMIN 500 MG tablet Commonly known as: GLUCOPHAGE Take 500 mg by mouth daily  with breakfast.   multivitamin-lutein Caps capsule Take 1 capsule by mouth daily.   pregabalin 50 MG capsule Commonly known as: LYRICA Take 50 mg by mouth at bedtime.       Contact information for after-discharge care    Destination    [facility] Preferred SNF .   Service: Skilled Nursing Contact information: [address] [place] [id number] [phone]                 No Known Allergies  Consultations:  None   Procedures/Studies: CT HEAD WO CONTRAST  Result Date: 04/11/2020 CLINICAL DATA:  Headache EXAM: CT HEAD WITHOUT CONTRAST TECHNIQUE: Contiguous axial images were obtained from the base of the skull through the vertex without intravenous contrast. COMPARISON:  None. FINDINGS: Brain: There is atrophy and chronic small vessel disease changes. No acute intracranial abnormality. Specifically, no hemorrhage, hydrocephalus, mass lesion, acute infarction, or significant intracranial injury. Vascular: No hyperdense vessel or unexpected calcification. Skull: No acute calvarial abnormality. Sinuses/Orbits: Visualized paranasal sinuses and mastoids clear. Orbital soft tissues unremarkable. Other: None IMPRESSION: Atrophy, chronic microvascular disease. No acute intracranial abnormality. Electronically Signed   By: Charlett NoseKevin  Dover M.D.   On: 04/11/2020 17:44   DG Chest Portable 1 View  Result Date: 04/11/2020 CLINICAL DATA:  Weakness, nausea EXAM: PORTABLE CHEST 1 VIEW COMPARISON:  None. FINDINGS: Cardiomegaly. Aortic atherosclerosis. Bibasilar atelectasis or scarring. Mild vascular congestion. No overt edema or effusions. No acute bony abnormality. IMPRESSION: Cardiomegaly, vascular congestion. Bibasilar atelectasis or scarring. Electronically Signed   By: Charlett NoseKevin  Dover M.D.   On: 04/11/2020 15:21      Subjective: Patient reports feeling better today she is awake alert answering all questions.  She ate her breakfast.  Denies any headache,  abdominal pain, chest pain or shortness of breath  Discharge Exam: Vitals:   04/15/20 0654 04/15/20 0821  BP: (!) 136/56 (!) 159/66  Pulse: 72 78  Resp: 18   Temp: 98.6 F (37 C) 98.9 F (37.2 C)  SpO2: 95% 98%   Vitals:   04/14/20 1206 04/14/20 2137 04/15/20 0654 04/15/20 0821  BP: (!) 146/63 (!) 144/60 (!) 136/56 (!) 159/66  Pulse: 77 86 72 78  Resp: 18 20 18    Temp: 99.1 F (37.3 C) 98.6 F (37 C) 98.6 F (37 C) 98.9 F (37.2 C)  TempSrc: Oral Oral Oral Oral  SpO2: 97% 98% 95% 98%  Weight:      Height:        General: Pt is alert, awake, not in acute distress Cardiovascular: RRR, S1/S2 +, no rubs, no gallops Respiratory: CTA bilaterally, no wheezing, no rhonchi Abdominal: Soft, NT, ND, bowel sounds + Extremities: no edema, no cyanosis Neuro 5 out of 5 motor strength x4.  Alert oriented x3    The results of significant diagnostics from this hospitalization (including imaging, microbiology, ancillary and laboratory) are listed below for reference.  Microbiology: Recent Results (from the past 240 hour(s))  Urine culture     Status: Abnormal   Collection Time: 04/11/20  3:01 PM   Specimen: Urine, Catheterized  Result Value Ref Range Status   Specimen Description   Final    URINE, CATHETERIZED Performed at [facility], [address] [place], [place] [address]    Special Requests   Final    NONE Performed at [facility], [address] [place], [place] [address]    Culture >=100,000 COLONIES/mL ESCHERICHIA COLI (A)  Final   Report Status 04/13/2020 FINAL  Final   Organism ID, Bacteria ESCHERICHIA COLI (A)  Final      Susceptibility   Escherichia coli - MIC*    AMPICILLIN >=32 RESISTANT Resistant     CEFAZOLIN <=4 SENSITIVE Sensitive     CEFTRIAXONE <=0.25 SENSITIVE Sensitive     CIPROFLOXACIN <=0.25 SENSITIVE Sensitive     GENTAMICIN <=1 SENSITIVE Sensitive     IMIPENEM <=0.25 SENSITIVE Sensitive     NITROFURANTOIN <=16  SENSITIVE Sensitive     TRIMETH/SULFA <=20 SENSITIVE Sensitive     AMPICILLIN/SULBACTAM 16 INTERMEDIATE Intermediate     PIP/TAZO <=4 SENSITIVE Sensitive     * >=100,000 COLONIES/mL ESCHERICHIA COLI  SARS Coronavirus 2 by RT PCR (hospital order, performed in [facility] hospital lab) Nasopharyngeal Nasopharyngeal Swab     Status: None   Collection Time: 04/11/20  3:01 PM   Specimen: Nasopharyngeal Swab  Result Value Ref Range Status   SARS Coronavirus 2 NEGATIVE NEGATIVE Final    Comment: (NOTE) SARS-CoV-2 target nucleic acids are NOT DETECTED.  The SARS-CoV-2 RNA is generally detectable in upper and lower respiratory specimens during the acute phase of infection. The lowest concentration of SARS-CoV-2 viral copies this assay can detect is 250 copies / mL. A negative result does not preclude SARS-CoV-2 infection and should not be used as the sole basis for treatment or other patient management decisions.  A negative result may occur with improper specimen collection / handling, submission of specimen other than nasopharyngeal swab, presence of viral mutation(s) within the areas targeted by this assay, and inadequate number of viral copies (<250 copies / mL). A negative result must be combined with clinical observations, patient history, and epidemiological information.  Fact Sheet for Patients:   BoilerBrush.com.cy  Fact Sheet for Healthcare Providers: https://pope.com/  This test is not yet approved or  cleared by the [place] FDA and has been authorized for detection and/or diagnosis of SARS-CoV-2 by FDA under an Emergency Use Authorization (EUA).  This EUA will remain in effect (meaning this test can be used) for the duration of the COVID-19 declaration under Section 564(b)(1) of the Act, 21 U.S.C. section 360bbb-3(b)(1), unless the authorization is terminated or revoked sooner.  Performed at [facility], [address] [place], [place] [address]      Labs: BNP (last 3 results) No results for input(s): BNP in the last 8760 hours. Basic Metabolic Panel: Recent Labs  Lab 04/11/20 1432 04/12/20 0914  NA 131* 137  K 4.0 3.8  CL 99 104  CO2 21* 23  GLUCOSE 157* 130*  BUN 26* 20  CREATININE 0.86 0.95  CALCIUM 9.0 8.6*   Liver Function Tests: Recent Labs  Lab 04/11/20 1432  AST 31  ALT 14  ALKPHOS 79  BILITOT 1.3*  PROT 8.2*  ALBUMIN 4.3   Recent Labs  Lab 04/11/20 1432  LIPASE 36   No results for input(s): AMMONIA  in the last 168 hours. CBC: Recent Labs  Lab 04/11/20 1432 04/12/20 0914  WBC 10.8* 8.5  NEUTROABS 7.5  --   HGB 13.7 12.4  HCT 39.6 36.0  MCV 90.6 89.8  PLT 241 206   Cardiac Enzymes: No results for input(s): CKTOTAL, CKMB, CKMBINDEX, TROPONINI in the last 168 hours. BNP: Invalid input(s): POCBNP CBG: Recent Labs  Lab 04/14/20 0759 04/14/20 1116 04/14/20 1657 04/14/20 2134 04/15/20 0832  GLUCAP 148* 205* 224* 246* 152*   D-Dimer No results for input(s): DDIMER in the last 72 hours. Hgb A1c No results for input(s): HGBA1C in the last 72 hours. Lipid Profile No results for input(s): CHOL, HDL, LDLCALC, TRIG, CHOLHDL, LDLDIRECT in the last 72 hours. Thyroid function studies No results for input(s): TSH, T4TOTAL, T3FREE, THYROIDAB in the last 72 hours.  Invalid input(s): FREET3 Anemia work up No results for input(s): VITAMINB12, FOLATE, FERRITIN, TIBC, IRON, RETICCTPCT in the last 72 hours. Urinalysis    Component Value Date/Time   COLORURINE YELLOW (A) 04/11/2020 1501   APPEARANCEUR CLOUDY (A) 04/11/2020 1501   LABSPEC 1.011 04/11/2020 1501   PHURINE 6.0 04/11/2020 1501   GLUCOSEU 50 (A) 04/11/2020 1501   HGBUR NEGATIVE 04/11/2020 1501   BILIRUBINUR NEGATIVE 04/11/2020 1501   KETONESUR NEGATIVE 04/11/2020 1501   PROTEINUR 100 (A) 04/11/2020 1501   NITRITE POSITIVE (A) 04/11/2020 1501   LEUKOCYTESUR LARGE (A) 04/11/2020 1501    Sepsis Labs Invalid input(s): PROCALCITONIN,  WBC,  LACTICIDVEN Microbiology Recent Results (from the past 240 hour(s))  Urine culture     Status: Abnormal   Collection Time: 04/11/20  3:01 PM   Specimen: Urine, Catheterized  Result Value Ref Range Status   Specimen Description   Final    URINE, CATHETERIZED Performed at [facility], [address] [place], [place] [address]    Special Requests   Final    NONE Performed at [facility], [address] [place], [place] [address]    Culture >=100,000 COLONIES/mL ESCHERICHIA COLI (A)  Final   Report Status 04/13/2020 FINAL  Final   Organism ID, Bacteria ESCHERICHIA COLI (A)  Final      Susceptibility   Escherichia coli - MIC*    AMPICILLIN >=32 RESISTANT Resistant     CEFAZOLIN <=4 SENSITIVE Sensitive     CEFTRIAXONE <=0.25 SENSITIVE Sensitive     CIPROFLOXACIN <=0.25 SENSITIVE Sensitive     GENTAMICIN <=1 SENSITIVE Sensitive     IMIPENEM <=0.25 SENSITIVE Sensitive     NITROFURANTOIN <=16 SENSITIVE Sensitive     TRIMETH/SULFA <=20 SENSITIVE Sensitive     AMPICILLIN/SULBACTAM 16 INTERMEDIATE Intermediate     PIP/TAZO <=4 SENSITIVE Sensitive     * >=100,000 COLONIES/mL ESCHERICHIA COLI  SARS Coronavirus 2 by RT PCR (hospital order, performed in [facility] hospital lab) Nasopharyngeal Nasopharyngeal Swab     Status: None   Collection Time: 04/11/20  3:01 PM   Specimen: Nasopharyngeal Swab  Result Value Ref Range Status   SARS Coronavirus 2 NEGATIVE NEGATIVE Final    Comment: (NOTE) SARS-CoV-2 target nucleic acids are NOT DETECTED.  The SARS-CoV-2 RNA is generally detectable in upper and lower respiratory specimens during the acute phase of infection. The lowest concentration of SARS-CoV-2 viral copies this assay can detect is 250 copies / mL. A negative result does not preclude SARS-CoV-2 infection and should not be used as the sole basis for treatment or other patient management decisions.  A  negative result may occur with improper specimen collection /  handling, submission of specimen other than nasopharyngeal swab, presence of viral mutation(s) within the areas targeted by this assay, and inadequate number of viral copies (<250 copies / mL). A negative result must be combined with clinical observations, patient history, and epidemiological information.  Fact Sheet for Patients:   BoilerBrush.com.cy  Fact Sheet for Healthcare Providers: https://pope.com/  This test is not yet approved or  cleared by the [place] FDA and has been authorized for detection and/or diagnosis of SARS-CoV-2 by FDA under an Emergency Use Authorization (EUA).  This EUA will remain in effect (meaning this test can be used) for the duration of the COVID-19 declaration under Section 564(b)(1) of the Act, 21 U.S.C. section 360bbb-3(b)(1), unless the authorization is terminated or revoked sooner.  Performed at [facility], [address] [place], [place] [address]      Time coordinating discharge: Over 30 minutes  SIGNED:   Fran Lowes, MD  [facility] 04/15/2020, 8:46 AM Pager   If 7PM-7AM, please contact night-coverage [email or web address] Password [email or web address]

## 2020-04-15 NOTE — TOC Progression Note (Signed)
Transition of Care [facility]) - Progression Note    Patient Details  Name: Madison Barnett MRN: [id number] Date of Birth: Jul 11, 1933  [facility] CM/SW Contact  Margarito Liner, LCSW Phone Number: 04/15/2020, 10:15 AM  Clinical Narrative:  [facility] is ready to accept patient whenever she arrives. CSW spoke with Malachi Paradise, NP with [facility]. She will make arrangements for pickup and call CSW with a time. Ms. Providence Lanius confirmed that patient has received both COVID-19 vaccines: Moderna.  Expected Discharge Plan: Home w Home Health Services    Expected Discharge Plan and Services Expected Discharge Plan: Home w Home Health Services   Discharge Planning Services: CM Consult Post Acute Care Choice: Durable Medical Equipment Living arrangements for the past 2 months: Single Family Home Expected Discharge Date: 04/15/20                                     Social Determinants of Health (SDOH) Interventions    Readmission Risk Interventions No flowsheet data found.

## 2020-04-15 NOTE — TOC Transition Note (Addendum)
Transition of Care [facility]) - CM/SW Discharge Note   Patient Details  Name: Madison Barnett MRN: [id number] Date of Birth: 07-03-1933  Transition of [facility]) CM/SW Contact:  Margarito Liner, LCSW Phone Number: 04/15/2020, 11:23 AM   Clinical Narrative: Patient has orders to discharge to [facility] today (Room 309). [facility] will pick her up around 12:30. Patient and son are aware and agreeable. RN has already called report. No further concerns. CSW signing off.    Final next level of care: Skilled Nursing Facility Barriers to Discharge: Barriers Resolved   Patient Goals and CMS Choice     Choice offered to / list presented to : Patient, Adult Children  Discharge Placement   Existing PASRR number confirmed : 04/14/20          Patient chooses bed at: [facility] [place] Patient to be transferred to facility by: [facility] Name of family member notified: marcie shearon Patient and family notified of of transfer: 04/15/20  Discharge Plan and Services   Discharge Planning Services: CM Consult Post Acute Care Choice: Durable Medical Equipment                               Social Determinants of Health (SDOH) Interventions     Readmission Risk Interventions No flowsheet data found.

## 2020-04-15 NOTE — Progress Notes (Signed)
Report called to Lonzo Cloud at [facility]. We are waiting for [facility] to transport the patient.

## 2020-04-15 NOTE — Progress Notes (Signed)
Patient dressed in her own clothing. IV removed. Patient assisted to the wheelchair supplied by [facility].

## 2020-04-15 NOTE — Care Management Important Message (Signed)
Important Message  Patient Details  Name: Madison Barnett MRN: [id number] Date of Birth: 20-Jun-1933   Medicare Important Message Given:  Yes     Johnell Comings 04/15/2020, 10:45 AM

## 2021-12-26 IMAGING — DX DG CHEST 1V PORT
1 series · 1 of 1 positions shown · non-contrast
Comparison: None.

CLINICAL DATA: Weakness, nausea

EXAM:
PORTABLE CHEST 1 VIEW

[chest ap]
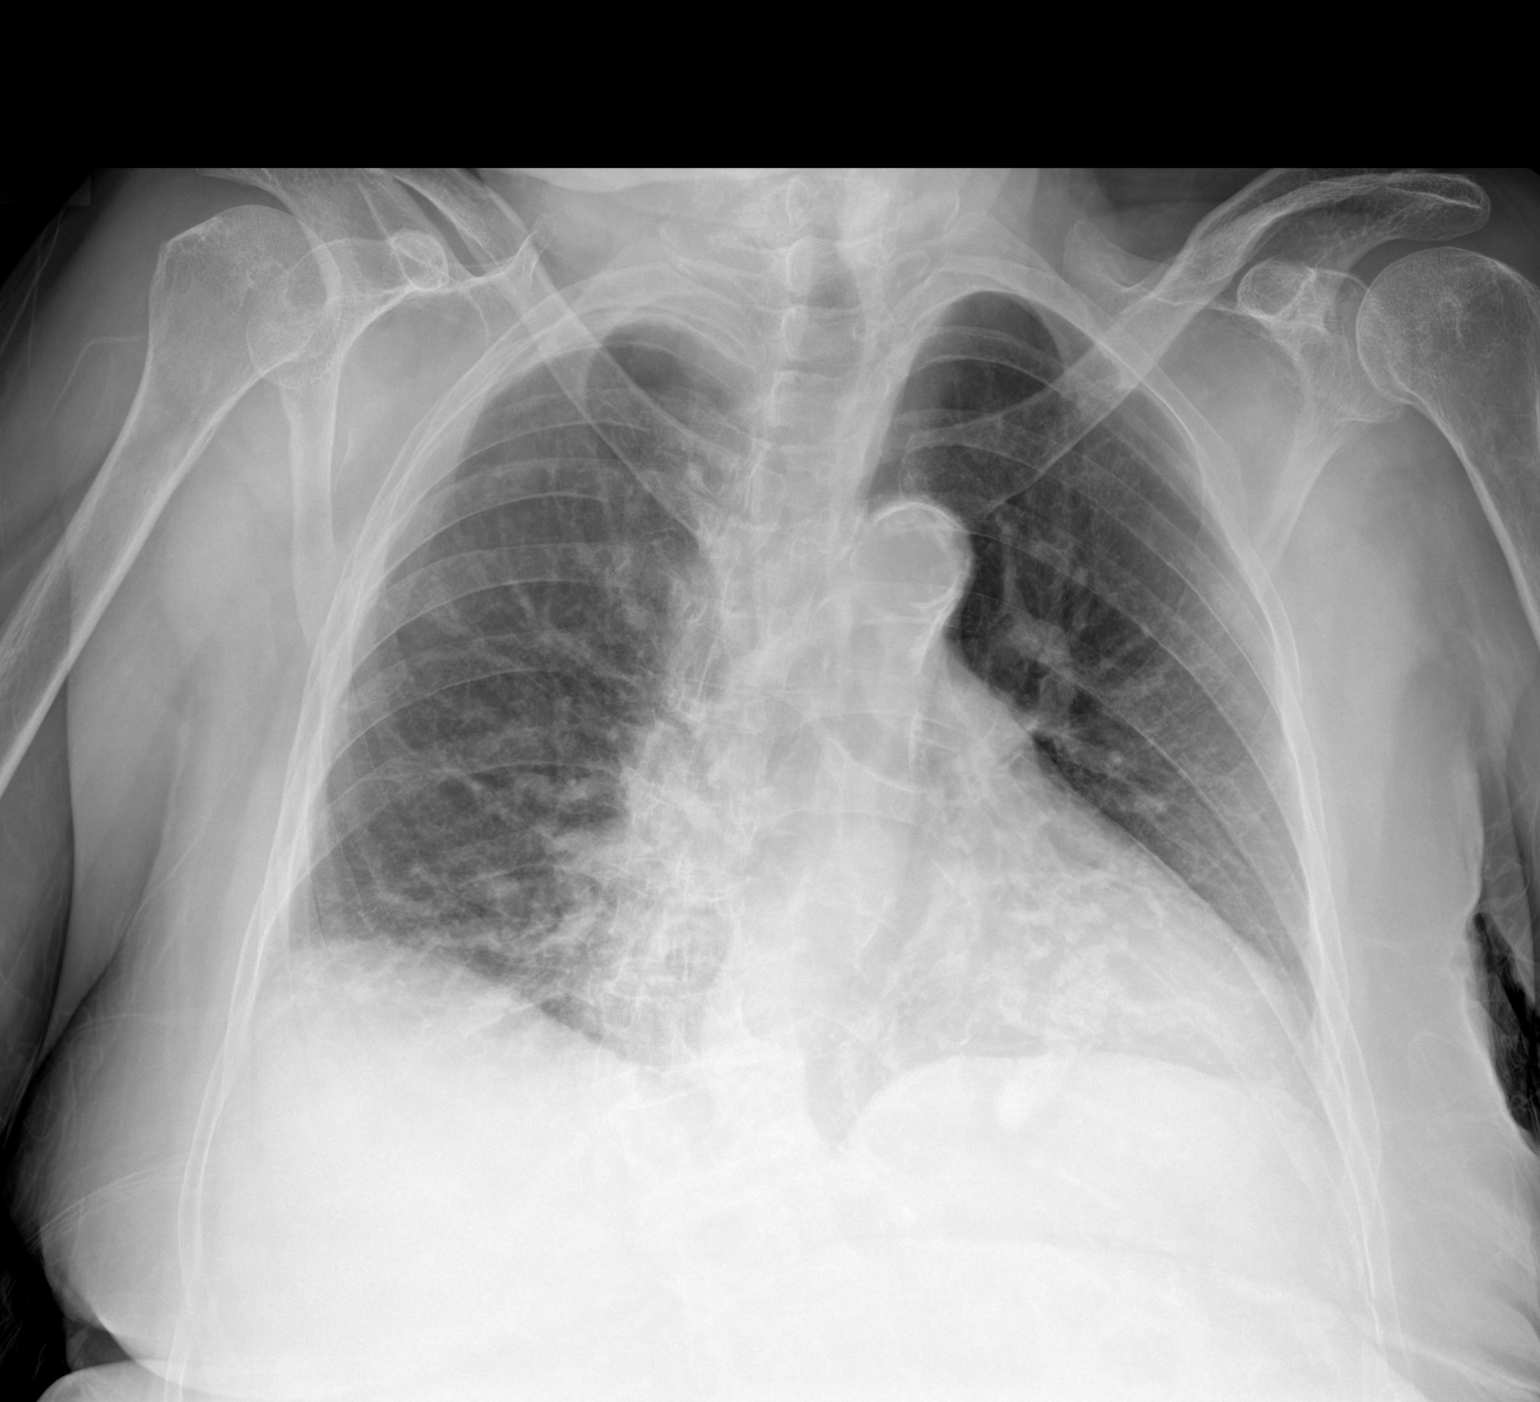

[1 of 1 positions shown; findings below may reference images not displayed]

FINDINGS: Cardiomegaly. Aortic atherosclerosis. Bibasilar atelectasis or
scarring. Mild vascular congestion. No overt edema or effusions. No
acute bony abnormality.
IMPRESSION: Cardiomegaly, vascular congestion.

Bibasilar atelectasis or scarring.
